# Patient Record
Sex: Female | Born: 1965 | Race: White | Hispanic: No | Marital: Single | Smoking: Former smoker
Health system: Southern US, Community
[De-identification: ages and names within clinical notes are randomized; demographics above are authoritative.]

## PROBLEM LIST (undated history)

## (undated) DIAGNOSIS — F32A Depression, unspecified: Secondary | ICD-10-CM

## (undated) DIAGNOSIS — M1990 Unspecified osteoarthritis, unspecified site: Secondary | ICD-10-CM

## (undated) DIAGNOSIS — F329 Major depressive disorder, single episode, unspecified: Secondary | ICD-10-CM

## (undated) DIAGNOSIS — T7840XA Allergy, unspecified, initial encounter: Secondary | ICD-10-CM

## (undated) DIAGNOSIS — E039 Hypothyroidism, unspecified: Secondary | ICD-10-CM

## (undated) DIAGNOSIS — F419 Anxiety disorder, unspecified: Secondary | ICD-10-CM

## (undated) HISTORY — PX: APPENDECTOMY: SHX54

## (undated) HISTORY — DX: Anxiety disorder, unspecified: F41.9

## (undated) HISTORY — DX: Depression, unspecified: F32.A

## (undated) HISTORY — DX: Unspecified osteoarthritis, unspecified site: M19.90

## (undated) HISTORY — DX: Allergy, unspecified, initial encounter: T78.40XA

## (undated) HISTORY — DX: Major depressive disorder, single episode, unspecified: F32.9

## (undated) HISTORY — DX: Hypothyroidism, unspecified: E03.9

---

## 2017-05-06 ENCOUNTER — Emergency Department
Admission: EM | Admit: 2017-05-06 | Discharge: 2017-05-06 | Disposition: A | Discharge status: Home / Self Care | Payer: Self-pay | Attending: Emergency Medicine | Admitting: Emergency Medicine

## 2017-05-06 ENCOUNTER — Encounter: Payer: Self-pay | Admitting: Emergency Medicine

## 2017-05-06 DIAGNOSIS — S81812A Laceration without foreign body, left lower leg, initial encounter: Secondary | ICD-10-CM | POA: Insufficient documentation

## 2017-05-06 DIAGNOSIS — Y929 Unspecified place or not applicable: Secondary | ICD-10-CM | POA: Insufficient documentation

## 2017-05-06 DIAGNOSIS — W01198A Fall on same level from slipping, tripping and stumbling with subsequent striking against other object, initial encounter: Secondary | ICD-10-CM | POA: Insufficient documentation

## 2017-05-06 DIAGNOSIS — F17200 Nicotine dependence, unspecified, uncomplicated: Secondary | ICD-10-CM | POA: Insufficient documentation

## 2017-05-06 DIAGNOSIS — Y9301 Activity, walking, marching and hiking: Secondary | ICD-10-CM | POA: Insufficient documentation

## 2017-05-06 DIAGNOSIS — Z23 Encounter for immunization: Secondary | ICD-10-CM | POA: Insufficient documentation

## 2017-05-06 DIAGNOSIS — Y999 Unspecified external cause status: Secondary | ICD-10-CM | POA: Insufficient documentation

## 2017-05-06 DIAGNOSIS — W25XXXA Contact with sharp glass, initial encounter: Secondary | ICD-10-CM | POA: Insufficient documentation

## 2017-05-06 MED ORDER — LORAZEPAM 1 MG PO TABS
1.0000 mg | ORAL_TABLET | Freq: Once | ORAL | Status: AC
  Administered 2017-05-06: 1 mg via ORAL
  Filled 2017-05-06: qty 1

## 2017-05-06 MED ORDER — CEPHALEXIN 500 MG PO CAPS: 500.0000 mg | ORAL_CAPSULE | Freq: Four times a day (QID) | ORAL | 0 refills | Status: AC

## 2017-05-06 MED ORDER — LIDOCAINE HCL (PF) 1 % IJ SOLN
5.0000 mL | Freq: Once | INTRAMUSCULAR | Status: DC
  Filled 2017-05-06: qty 5

## 2017-05-06 MED ORDER — TETANUS-DIPHTH-ACELL PERTUSSIS 5-2.5-18.5 LF-MCG/0.5 IM SUSP
0.5000 mL | Freq: Once | INTRAMUSCULAR | Status: AC
  Administered 2017-05-06: 0.5 mL via INTRAMUSCULAR
  Filled 2017-05-06: qty 0.5

## 2017-05-06 NOTE — ED Provider Notes (Signed)
[facility] Emergency Department Provider Note  ____________________________________________  Time seen: Approximately 12:26 PM  I have reviewed the triage vital signs and the nursing notes.   HISTORY  Chief Complaint Extremity Laceration    HPI Nicole Mccarty is a 51 y.o. female that presents to the emergency department with left foot laceration. Patient cut her foot on glass last night after tripping over her dog. She denies any additional injuries. She does not recall last tetanus shot. No fever, shortness of breath, chest pain, numbness, tingling.   History reviewed. No pertinent past medical history.  There are no active problems to display for this patient.   History reviewed. No pertinent surgical history.  Prior to Admission medications   Medication Sig Start Date End Date Taking? Authorizing Provider  cephALEXin (KEFLEX) 500 MG capsule Take 1 capsule (500 mg total) by mouth 4 (four) times daily. 05/06/17 05/16/17  Laban Emperor, PA-C    Allergies Patient has no known allergies.  No family history on file.  Social History Social History  Substance Use Topics  . Smoking status: Current Some Day Smoker  . Smokeless tobacco: Never Used  . Alcohol use Yes     Comment: 1 beer per day     Review of Systems  Constitutional: No fever/chills Cardiovascular: No chest pain. Respiratory: No SOB. Gastrointestinal: No abdominal pain.  No nausea, no vomiting.  Skin: Negative for rash,  ecchymosis. Neurological: Negative for headaches, numbness or tingling   ____________________________________________   PHYSICAL EXAM:  VITAL SIGNS: ED Triage Vitals  Enc Vitals Group     BP 05/06/17 1136 (!) 173/96     Pulse Rate 05/06/17 1136 98     Resp --      Temp 05/06/17 1136 98 F (36.7 C)     Temp Source 05/06/17 1136 Oral     SpO2 05/06/17 1136 100 %     Weight 05/06/17 1132 140 lb (63.5 kg)     Height 05/06/17 1132 5\' 5"  (1.651 m)     Head  Circumference --      Peak Flow --      Pain Score 05/06/17 1132 0     Pain Loc --      Pain Edu? --      Excl. in [place]? --      Constitutional: Alert and oriented. Well appearing and in no acute distress. Eyes: Conjunctivae are normal. PERRL. EOMI. Head: Atraumatic. ENT:      Ears:      Nose: No congestion/rhinnorhea.      Mouth/Throat: Mucous membranes are moist.  Neck: No stridor.  Cardiovascular: Normal rate, regular rhythm.  Good peripheral circulation. Respiratory: Normal respiratory effort without tachypnea or retractions. Lungs CTAB. Good air entry to the bases with no decreased or absent breath sounds. Musculoskeletal: Full range of motion to all extremities. No gross deformities appreciated. Neurologic:  Normal speech and language. No gross focal neurologic deficits are appreciated.  Skin:  Skin is warm, dry and intact. 2 cm well approximated shallow laceration to left shin.   ____________________________________________   LABS (all labs ordered are listed, but only abnormal results are displayed)  Labs Reviewed - No data to display ____________________________________________  EKG   ____________________________________________  RADIOLOGY  No results found.  ____________________________________________    PROCEDURES  Procedure(s) performed:    Procedures  LACERATION REPAIR Performed by: Laban Emperor  Consent: Verbal consent obtained.  Consent given by: patient  Prepped and Draped in normal sterile fashion  Wound explored: No foreign bodies   Laceration Location: left shin  Laceration Length: 2 cm  Anesthesia: None  Local anesthetic: lidocaine 1% without epinephrine  Anesthetic total: 3 ml  Irrigation method: syringe  Amount of cleaning: 554ml normal saline  Skin closure: 4-0 nylon  Number of sutures: 7  Technique: Simple interrupted  Patient tolerance: Patient tolerated the procedure well with no immediate  complications.  Medications  lidocaine (PF) (XYLOCAINE) 1 % injection 5 mL (not administered)  LORazepam (ATIVAN) tablet 1 mg (1 mg Oral Given 05/06/17 1203)  Tdap (BOOSTRIX) injection 0.5 mL (0.5 mLs Intramuscular Given 05/06/17 1202)     ____________________________________________   INITIAL IMPRESSION / ASSESSMENT AND PLAN / ED COURSE  Pertinent labs & imaging results that were available during my care of the patient were reviewed by me and considered in my medical decision making (see chart for details).  Review of the [place] CSRS was performed in accordance of the [place] prior to dispensing any controlled drugs.    Patient's diagnosis is consistent with leg laceration. Vital signs and exam are reassuring. Laceration was repaired with stitches. Patient will be discharged home with prescriptions for keflex. Patient is to follow up with PCP as directed. Patient is given ED precautions to return to the ED for any worsening or new symptoms.     ____________________________________________  FINAL CLINICAL IMPRESSION(S) / ED DIAGNOSES  Final diagnoses:  Laceration of left lower extremity, initial encounter      NEW MEDICATIONS STARTED DURING THIS VISIT:  Discharge Medication List as of 05/06/2017  1:06 PM    START taking these medications   Details  cephALEXin (KEFLEX) 500 MG capsule Take 1 capsule (500 mg total) by mouth 4 (four) times daily., Starting Sun 05/06/2017, Until Wed 05/16/2017, Print            This chart was dictated using voice recognition software/Dragon. Despite best efforts to proofread, errors can occur which can change the meaning. Any change was purely unintentional.    Laban Emperor, PA-C 05/06/17 1534    Delman Kitten, MD 05/06/17 [phone]

## 2017-05-06 NOTE — ED Triage Notes (Signed)
Patient presents to the ED with a laceration to her left foot that occurred around midnight last night.  Patient states she accidentally knocked a pane of glass off the wall and it fell and hit her foot.  Patient describes the laceration as approx. 4 inches long.  Patient has a long bandaid to area at this time.

## 2018-05-22 ENCOUNTER — Telehealth: Payer: Self-pay

## 2018-05-22 NOTE — Telephone Encounter (Signed)
Copied from [id number]. Topic: Appointment Scheduling - New Patient >> May 22, 2018 11:15 AM Margot Ables wrote: New patient has been scheduled for your office. Provider: McLean-Scocuzza Date of Appointment: 07/05/18  Route to department's PEC pool.

## 2018-05-29 NOTE — Telephone Encounter (Signed)
NP paperwork mailed out on 10/02.

## 2018-07-05 ENCOUNTER — Other Ambulatory Visit: Payer: Self-pay

## 2018-07-05 ENCOUNTER — Ambulatory Visit (INDEPENDENT_AMBULATORY_CARE_PROVIDER_SITE_OTHER): Payer: BLUE CROSS/BLUE SHIELD

## 2018-07-05 ENCOUNTER — Encounter: Payer: Self-pay | Admitting: Internal Medicine

## 2018-07-05 ENCOUNTER — Ambulatory Visit (INDEPENDENT_AMBULATORY_CARE_PROVIDER_SITE_OTHER): Payer: BLUE CROSS/BLUE SHIELD | Admitting: Internal Medicine

## 2018-07-05 VITALS — BP 140/98 | HR 72 | Temp 98.0°F | Resp 16 | Ht 65.0 in | Wt 157.0 lb

## 2018-07-05 DIAGNOSIS — M79641 Pain in right hand: Secondary | ICD-10-CM

## 2018-07-05 DIAGNOSIS — E039 Hypothyroidism, unspecified: Secondary | ICD-10-CM

## 2018-07-05 DIAGNOSIS — M2550 Pain in unspecified joint: Secondary | ICD-10-CM | POA: Diagnosis not present

## 2018-07-05 DIAGNOSIS — Z1231 Encounter for screening mammogram for malignant neoplasm of breast: Secondary | ICD-10-CM | POA: Diagnosis not present

## 2018-07-05 DIAGNOSIS — M79675 Pain in left toe(s): Secondary | ICD-10-CM

## 2018-07-05 DIAGNOSIS — E559 Vitamin D deficiency, unspecified: Secondary | ICD-10-CM

## 2018-07-05 DIAGNOSIS — Z1389 Encounter for screening for other disorder: Secondary | ICD-10-CM

## 2018-07-05 DIAGNOSIS — Z1211 Encounter for screening for malignant neoplasm of colon: Secondary | ICD-10-CM | POA: Diagnosis not present

## 2018-07-05 DIAGNOSIS — M1990 Unspecified osteoarthritis, unspecified site: Secondary | ICD-10-CM | POA: Insufficient documentation

## 2018-07-05 DIAGNOSIS — Z13220 Encounter for screening for lipoid disorders: Secondary | ICD-10-CM | POA: Diagnosis not present

## 2018-07-05 DIAGNOSIS — M79642 Pain in left hand: Secondary | ICD-10-CM

## 2018-07-05 DIAGNOSIS — I10 Essential (primary) hypertension: Secondary | ICD-10-CM

## 2018-07-05 DIAGNOSIS — T7840XA Allergy, unspecified, initial encounter: Secondary | ICD-10-CM

## 2018-07-05 DIAGNOSIS — Z8371 Family history of colonic polyps: Secondary | ICD-10-CM

## 2018-07-05 LAB — COMPREHENSIVE METABOLIC PANEL
ALK PHOS: 53 U/L (ref 39–117)
ALT: 11 U/L (ref 0–35)
AST: 13 U/L (ref 0–37)
Albumin: 4.4 g/dL (ref 3.5–5.2)
BILIRUBIN TOTAL: 0.4 mg/dL (ref 0.2–1.2)
BUN: 13 mg/dL (ref 6–23)
CO2: 29 meq/L (ref 19–32)
Calcium: 9.9 mg/dL (ref 8.4–10.5)
Chloride: 103 mEq/L (ref 96–112)
Creatinine, Ser: 0.63 mg/dL (ref 0.40–1.20)
GFR: 104.97 mL/min (ref >60.00)
Glucose, Bld: 102 mg/dL — ABNORMAL HIGH (ref 70–99)
Potassium: 4.6 mEq/L (ref 3.5–5.1)
Sodium: 139 mEq/L (ref 135–145)
Total Protein: 7 g/dL (ref 6.0–8.3)

## 2018-07-05 LAB — CBC WITH DIFFERENTIAL/PLATELET
BASOS ABS: 0.1 10*3/uL (ref 0.0–0.1)
Basophils Relative: 1 % (ref 0.0–3.0)
Eosinophils Absolute: 0.1 10*3/uL (ref 0.0–0.7)
Eosinophils Relative: 2.3 % (ref 0.0–5.0)
HCT: 39.5 % (ref 36.0–46.0)
HEMOGLOBIN: 13.2 g/dL (ref 12.0–15.0)
Lymphocytes Relative: 30.1 % (ref 12.0–46.0)
Lymphs Abs: 1.7 10*3/uL (ref 0.7–4.0)
MCHC: 33.3 g/dL (ref 30.0–36.0)
MCV: 94.3 fl (ref 78.0–100.0)
MONOS PCT: 7.1 % (ref 3.0–12.0)
Monocytes Absolute: 0.4 10*3/uL (ref 0.1–1.0)
NEUTROS ABS: 3.4 10*3/uL (ref 1.4–7.7)
Neutrophils Relative %: 59.5 % (ref 43.0–77.0)
Platelets: 342 10*3/uL (ref 150.0–400.0)
RBC: 4.19 Mil/uL (ref 3.87–5.11)
RDW: 13.1 % (ref 11.5–15.5)
WBC: 5.8 10*3/uL (ref 4.0–10.5)

## 2018-07-05 LAB — URINALYSIS, ROUTINE W REFLEX MICROSCOPIC
Bilirubin Urine: NEGATIVE
Glucose, UA: NEGATIVE
Hgb urine dipstick: NEGATIVE
Ketones, ur: NEGATIVE
LEUKOCYTES UA: NEGATIVE
NITRITE: NEGATIVE
PROTEIN: NEGATIVE
SPECIFIC GRAVITY, URINE: 1.005 (ref 1.001–1.03)
pH: 7 (ref 5.0–8.0)

## 2018-07-05 LAB — URIC ACID: Uric Acid, Serum: 3.6 mg/dL (ref 2.4–7.0)

## 2018-07-05 LAB — LIPID PANEL
CHOLESTEROL: 188 mg/dL (ref 0–200)
HDL: 71.8 mg/dL (ref >39.00)
LDL CALC: 103 mg/dL — AB (ref 0–99)
NonHDL: 116.5
TRIGLYCERIDES: 70 mg/dL (ref 0.0–149.0)
Total CHOL/HDL Ratio: 3
VLDL: 14 mg/dL (ref 0.0–40.0)

## 2018-07-05 LAB — T4, FREE: Free T4: 0.81 ng/dL (ref 0.60–1.60)

## 2018-07-05 LAB — VITAMIN D 25 HYDROXY (VIT D DEFICIENCY, FRACTURES): VITD: 39.12 ng/mL (ref 30.00–100.00)

## 2018-07-05 LAB — C-REACTIVE PROTEIN: CRP: 0.1 mg/dL — AB (ref 0.5–20.0)

## 2018-07-05 LAB — SEDIMENTATION RATE: Sed Rate: 2 mm/hr (ref 0–30)

## 2018-07-05 LAB — TSH: TSH: 1.73 u[IU]/mL (ref 0.35–4.50)

## 2018-07-05 MED ORDER — HYDROCHLOROTHIAZIDE 12.5 MG PO TABS: 12.5000 mg | ORAL_TABLET | Freq: Every day | ORAL | 2 refills | Status: DC

## 2018-07-05 NOTE — Patient Instructions (Signed)
Arthritis Arthritis is a term that is commonly used to refer to joint pain or joint disease. There are more than 100 types of arthritis. What are the causes? The most common cause of this condition is wear and tear of a joint. Other causes include:  Gout.  Inflammation of a joint.  An infection of a joint.  Sprains and other injuries near the joint.  A drug reaction or allergic reaction.  In some cases, the cause may not be known. What are the signs or symptoms? The main symptom of this condition is pain in the joint with movement. Other symptoms include:  Redness, swelling, or stiffness at a joint.  Warmth coming from the joint.  Fever.  Overall feeling of illness.  How is this diagnosed? This condition may be diagnosed with a physical exam and tests, including:  Blood tests.  Urine tests.  Imaging tests, such as MRI, X-rays, or a CT scan.  Sometimes, fluid is removed from a joint for testing. How is this treated? Treatment for this condition may involve:  Treatment of the cause, if it is known.  Rest.  Raising (elevating) the joint.  Applying cold or hot packs to the joint.  Medicines to improve symptoms and reduce inflammation.  Injections of a steroid such as cortisone into the joint to help reduce pain and inflammation.  Depending on the cause of your arthritis, you may need to make lifestyle changes to reduce stress on your joint. These changes may include exercising more and losing weight. Follow these instructions at home: Medicines  Take over-the-counter and prescription medicines only as told by your health care provider.  Do not take aspirin to relieve pain if gout is suspected. Activity  Rest your joint if told by your health care provider. Rest is important when your disease is active and your joint feels painful, swollen, or stiff.  Avoid activities that make the pain worse. It is important to balance activity with rest.  Exercise your  joint regularly with range-of-motion exercises as told by your health care provider. Try doing low-impact exercise, such as: ? Swimming. ? Water aerobics. ? Biking. ? Walking. Joint Care   If your joint is swollen, keep it elevated if told by your health care provider.  If your joint feels stiff in the morning, try taking a warm shower.  If directed, apply heat to the joint. If you have diabetes, do not apply heat without permission from your health care provider. ? Put a towel between the joint and the hot pack or heating pad. ? Leave the heat on the area for 20-30 minutes.  If directed, apply ice to the joint: ? Put ice in a plastic bag. ? Place a towel between your skin and the bag. ? Leave the ice on for 20 minutes, 2-3 times per day.  Keep all follow-up visits as told by your health care provider. This is important. Contact a health care provider if:  The pain gets worse.  You have a fever. Get help right away if:  You develop severe joint pain, swelling, or redness.  Many joints become painful and swollen.  You develop severe back pain.  You develop severe weakness in your leg.  You cannot control your bladder or bowels. This information is not intended to replace advice given to you by your health care provider. Make sure you discuss any questions you have with your health care provider. Document Released: 09/21/2004 Document Revised: 01/20/2016 Document Reviewed: 11/09/2014 Elsevier Interactive Patient   Education  2018 [place] Eating Plan DASH stands for "Dietary Approaches to Stop Hypertension." The DASH eating plan is a healthy eating plan that has been shown to reduce high blood pressure (hypertension). It may also reduce your risk for type 2 diabetes, heart disease, and stroke. The DASH eating plan may also help with weight loss. What are tips for following this plan? General guidelines  Avoid eating more than 2,300 mg (milligrams) of salt  (sodium) a day. If you have hypertension, you may need to reduce your sodium intake to 1,500 mg a day.  Limit alcohol intake to no more than 1 drink a day for nonpregnant women and 2 drinks a day for men. One drink equals 12 oz of beer, 5 oz of wine, or 1 oz of hard liquor.  Work with your health care provider to maintain a healthy body weight or to lose weight. Ask what an ideal weight is for you.  Get at least 30 minutes of exercise that causes your heart to beat faster (aerobic exercise) most days of the week. Activities may include walking, swimming, or biking.  Work with your health care provider or diet and nutrition specialist (dietitian) to adjust your eating plan to your individual calorie needs. Reading food labels  Check food labels for the amount of sodium per serving. Choose foods with less than 5 percent of the Daily Value of sodium. Generally, foods with less than 300 mg of sodium per serving fit into this eating plan.  To find whole grains, look for the word "whole" as the first word in the ingredient list. Shopping  Buy products labeled as "low-sodium" or "no salt added."  Buy fresh foods. Avoid canned foods and premade or frozen meals. Cooking  Avoid adding salt when cooking. Use salt-free seasonings or herbs instead of table salt or sea salt. Check with your health care provider or pharmacist before using salt substitutes.  Do not fry foods. Cook foods using healthy methods such as baking, boiling, grilling, and broiling instead.  Cook with heart-healthy oils, such as olive, canola, soybean, or sunflower oil. Meal planning   Eat a balanced diet that includes: ? 5 or more servings of fruits and vegetables each day. At each meal, try to fill half of your plate with fruits and vegetables. ? Up to 6-8 servings of whole grains each day. ? Less than 6 oz of lean meat, poultry, or fish each day. A 3-oz serving of meat is about the same size as a deck of cards. One egg  equals 1 oz. ? 2 servings of low-fat dairy each day. ? A serving of nuts, seeds, or beans 5 times each week. ? Heart-healthy fats. Healthy fats called Omega-3 fatty acids are found in foods such as flaxseeds and coldwater fish, like sardines, salmon, and mackerel.  Limit how much you eat of the following: ? Canned or prepackaged foods. ? Food that is high in trans fat, such as fried foods. ? Food that is high in saturated fat, such as fatty meat. ? Sweets, desserts, sugary drinks, and other foods with added sugar. ? Full-fat dairy products.  Do not salt foods before eating.  Try to eat at least 2 vegetarian meals each week.  Eat more home-cooked food and less restaurant, buffet, and fast food.  When eating at a restaurant, ask that your food be prepared with less salt or no salt, if possible. What foods are recommended? The items listed may not be a complete  list. Talk with your dietitian about what dietary choices are best for you. Grains Whole-grain or whole-wheat bread. Whole-grain or whole-wheat pasta. Brown rice. [place]. Bulgur. Whole-grain and low-sodium cereals. Pita bread. Low-fat, low-sodium crackers. Whole-wheat flour tortillas. Vegetables Fresh or frozen vegetables (raw, steamed, roasted, or grilled). Low-sodium or reduced-sodium tomato and vegetable juice. Low-sodium or reduced-sodium tomato sauce and tomato paste. Low-sodium or reduced-sodium canned vegetables. Fruits All fresh, dried, or frozen fruit. Canned fruit in natural juice (without added sugar). Meat and other protein foods Skinless chicken or [place]. Ground chicken or [place]. Pork with fat trimmed off. Fish and seafood. Egg whites. Dried beans, peas, or lentils. Unsalted nuts, nut butters, and seeds. Unsalted canned beans. Lean cuts of beef with fat trimmed off. Low-sodium, lean deli meat. Dairy Low-fat (1%) or fat-free (skim) milk. Fat-free, low-fat, or reduced-fat cheeses. Nonfat, low-sodium ricotta or  cottage cheese. Low-fat or nonfat yogurt. Low-fat, low-sodium cheese. Fats and oils Soft margarine without trans fats. Vegetable oil. Low-fat, reduced-fat, or light mayonnaise and salad dressings (reduced-sodium). Canola, safflower, olive, soybean, and sunflower oils. Avocado. Seasoning and other foods Herbs. Spices. Seasoning mixes without salt. Unsalted popcorn and pretzels. Fat-free sweets. What foods are not recommended? The items listed may not be a complete list. Talk with your dietitian about what dietary choices are best for you. Grains Baked goods made with fat, such as croissants, muffins, or some breads. Dry pasta or rice meal packs. Vegetables Creamed or fried vegetables. Vegetables in a cheese sauce. Regular canned vegetables (not low-sodium or reduced-sodium). Regular canned tomato sauce and paste (not low-sodium or reduced-sodium). Regular tomato and vegetable juice (not low-sodium or reduced-sodium). Angie Fava. Olives. Fruits Canned fruit in a light or heavy syrup. Fried fruit. Fruit in cream or butter sauce. Meat and other protein foods Fatty cuts of meat. Ribs. Fried meat. Berniece Salines. Sausage. Bologna and other processed lunch meats. Salami. Fatback. Hotdogs. Bratwurst. Salted nuts and seeds. Canned beans with added salt. Canned or smoked fish. Whole eggs or egg yolks. Chicken or [place] with skin. Dairy Whole or 2% milk, cream, and half-and-half. Whole or full-fat cream cheese. Whole-fat or sweetened yogurt. Full-fat cheese. Nondairy creamers. Whipped toppings. Processed cheese and cheese spreads. Fats and oils Butter. Stick margarine. Lard. Shortening. Ghee. Bacon fat. Tropical oils, such as coconut, palm kernel, or palm oil. Seasoning and other foods Salted popcorn and pretzels. Onion salt, garlic salt, seasoned salt, table salt, and sea salt. Worcestershire sauce. Tartar sauce. Barbecue sauce. Teriyaki sauce. Soy sauce, including reduced-sodium. Steak sauce. Canned and packaged  gravies. Fish sauce. Oyster sauce. Cocktail sauce. Horseradish that you find on the shelf. Ketchup. Mustard. Meat flavorings and tenderizers. Bouillon cubes. Hot sauce and Tabasco sauce. Premade or packaged marinades. Premade or packaged taco seasonings. Relishes. Regular salad dressings. Where to find more information:  National Heart, Lung, and [place]: https://wilson-eaton.com/  American Heart Association: www.heart.org Summary  The DASH eating plan is a healthy eating plan that has been shown to reduce high blood pressure (hypertension). It may also reduce your risk for type 2 diabetes, heart disease, and stroke.  With the DASH eating plan, you should limit salt (sodium) intake to 2,300 mg a day. If you have hypertension, you may need to reduce your sodium intake to 1,500 mg a day.  When on the DASH eating plan, aim to eat more fresh fruits and vegetables, whole grains, lean proteins, low-fat dairy, and heart-healthy fats.  Work with your health care provider or diet and nutrition specialist (dietitian) to  adjust your eating plan to your individual calorie needs. This information is not intended to replace advice given to you by your health care provider. Make sure you discuss any questions you have with your health care provider. Document Released: 08/03/2011 Document Revised: 08/07/2016 Document Reviewed: 08/07/2016 Elsevier Interactive Patient Education  2018 [employer].  Hypertension Hypertension, commonly called high blood pressure, is when the force of blood pumping through the arteries is too strong. The arteries are the blood vessels that carry blood from the heart throughout the body. Hypertension forces the heart to work harder to pump blood and may cause arteries to become narrow or stiff. Having untreated or uncontrolled hypertension can cause heart attacks, strokes, kidney disease, and other problems. A blood pressure reading consists of a higher number over a lower number.  Ideally, your blood pressure should be below 120/80. The first ("top") number is called the systolic pressure. It is a measure of the pressure in your arteries as your heart beats. The second ("bottom") number is called the diastolic pressure. It is a measure of the pressure in your arteries as the heart relaxes. What are the causes? The cause of this condition is not known. What increases the risk? Some risk factors for high blood pressure are under your control. Others are not. Factors you can change  Smoking.  Having type 2 diabetes mellitus, high cholesterol, or both.  Not getting enough exercise or physical activity.  Being overweight.  Having too much fat, sugar, calories, or salt (sodium) in your diet.  Drinking too much alcohol. Factors that are difficult or impossible to change  Having chronic kidney disease.  Having a family history of high blood pressure.  Age. Risk increases with age.  Race. You may be at higher risk if you are African-American.  Gender. Men are at higher risk than women before age 64. After age 59, women are at higher risk than men.  Having obstructive sleep apnea.  Stress. What are the signs or symptoms? Extremely high blood pressure (hypertensive crisis) may cause:  Headache.  Anxiety.  Shortness of breath.  Nosebleed.  Nausea and vomiting.  Severe chest pain.  Jerky movements you cannot control (seizures).  How is this diagnosed? This condition is diagnosed by measuring your blood pressure while you are seated, with your arm resting on a surface. The cuff of the blood pressure monitor will be placed directly against the skin of your upper arm at the level of your heart. It should be measured at least twice using the same arm. Certain conditions can cause a difference in blood pressure between your right and left arms. Certain factors can cause blood pressure readings to be lower or higher than normal (elevated) for a short period of  time:  When your blood pressure is higher when you are in a health care provider's office than when you are at home, this is called white coat hypertension. Most people with this condition do not need medicines.  When your blood pressure is higher at home than when you are in a health care provider's office, this is called masked hypertension. Most people with this condition may need medicines to control blood pressure.  If you have a high blood pressure reading during one visit or you have normal blood pressure with other risk factors:  You may be asked to return on a different day to have your blood pressure checked again.  You may be asked to monitor your blood pressure at home for 1  week or longer.  If you are diagnosed with hypertension, you may have other blood or imaging tests to help your health care provider understand your overall risk for other conditions. How is this treated? This condition is treated by making healthy lifestyle changes, such as eating healthy foods, exercising more, and reducing your alcohol intake. Your health care provider may prescribe medicine if lifestyle changes are not enough to get your blood pressure under control, and if:  Your systolic blood pressure is above 130.  Your diastolic blood pressure is above 80.  Your personal target blood pressure may vary depending on your medical conditions, your age, and other factors. Follow these instructions at home: Eating and drinking  Eat a diet that is high in fiber and potassium, and low in sodium, added sugar, and fat. An example eating plan is called the DASH (Dietary Approaches to Stop Hypertension) diet. To eat this way: ? Eat plenty of fresh fruits and vegetables. Try to fill half of your plate at each meal with fruits and vegetables. ? Eat whole grains, such as whole wheat pasta, brown rice, or whole grain bread. Fill about one quarter of your plate with whole grains. ? Eat or drink low-fat dairy  products, such as skim milk or low-fat yogurt. ? Avoid fatty cuts of meat, processed or cured meats, and poultry with skin. Fill about one quarter of your plate with lean proteins, such as fish, chicken without skin, beans, eggs, and tofu. ? Avoid premade and processed foods. These tend to be higher in sodium, added sugar, and fat.  Reduce your daily sodium intake. Most people with hypertension should eat less than 1,500 mg of sodium a day.  Limit alcohol intake to no more than 1 drink a day for nonpregnant women and 2 drinks a day for men. One drink equals 12 oz of beer, 5 oz of wine, or 1 oz of hard liquor. Lifestyle  Work with your health care provider to maintain a healthy body weight or to lose weight. Ask what an ideal weight is for you.  Get at least 30 minutes of exercise that causes your heart to beat faster (aerobic exercise) most days of the week. Activities may include walking, swimming, or biking.  Include exercise to strengthen your muscles (resistance exercise), such as pilates or lifting weights, as part of your weekly exercise routine. Try to do these types of exercises for 30 minutes at least 3 days a week.  Do not use any products that contain nicotine or tobacco, such as cigarettes and e-cigarettes. If you need help quitting, ask your health care provider.  Monitor your blood pressure at home as told by your health care provider.  Keep all follow-up visits as told by your health care provider. This is important. Medicines  Take over-the-counter and prescription medicines only as told by your health care provider. Follow directions carefully. Blood pressure medicines must be taken as prescribed.  Do not skip doses of blood pressure medicine. Doing this puts you at risk for problems and can make the medicine less effective.  Ask your health care provider about side effects or reactions to medicines that you should watch for. Contact a health care provider if:  You  think you are having a reaction to a medicine you are taking.  You have headaches that keep coming back (recurring).  You feel dizzy.  You have swelling in your ankles.  You have trouble with your vision. Get help right away if:  You  develop a severe headache or confusion.  You have unusual weakness or numbness.  You feel faint.  You have severe pain in your chest or abdomen.  You vomit repeatedly.  You have trouble breathing. Summary  Hypertension is when the force of blood pumping through your arteries is too strong. If this condition is not controlled, it may put you at risk for serious complications.  Your personal target blood pressure may vary depending on your medical conditions, your age, and other factors. For most people, a normal blood pressure is less than 120/80.  Hypertension is treated with lifestyle changes, medicines, or a combination of both. Lifestyle changes include weight loss, eating a healthy, low-sodium diet, exercising more, and limiting alcohol. This information is not intended to replace advice given to you by your health care provider. Make sure you discuss any questions you have with your health care provider. Document Released: 08/14/2005 Document Revised: 07/12/2016 Document Reviewed: 07/12/2016 Elsevier Interactive Patient Education  [employer].

## 2018-07-05 NOTE — Progress Notes (Signed)
Chief Complaint  Patient presents with  . Establish Care   New patient  1. C/o b/l hand pain and jt swelling left >right and left toe pain she takes aleve as needed with relief  2. H/o anxiety depression when dad died was on klonopin, prozac and wellbutrin does not want to restart  3. Hypothyroidism was on levo 125 mcg 10 years ago not off  4. H/o HTN not on meds    Review of Systems  Constitutional: Negative for weight loss.  HENT: Negative for hearing loss.   Eyes: Negative for blurred vision.  Respiratory: Negative for shortness of breath.   Cardiovascular: Negative for chest pain.  Gastrointestinal: Negative for abdominal pain.  Musculoskeletal: Positive for joint pain.  Skin: Negative for rash.  Neurological: Negative for headaches.  Psychiatric/Behavioral: Negative for depression. The patient is not nervous/anxious.    Past Medical History:  Diagnosis Date  . Allergy   . Anxiety    was on klonopin, wellbutrin, prozac now off no restart   . Arthritis    hands   . Depression   . Hypothyroidism    Past Surgical History:  Procedure Laterality Date  . APPENDECTOMY     1979   Family History  Problem Relation Age of Onset  . Arthritis Mother   . Cancer Mother        mouth cancer roof of mouth  . Hearing loss Mother   . Heart disease Mother   . Kidney disease Mother   . Hypertension Mother   . Cancer Father        melanoma  . Diabetes Father   . Hearing loss Father   . Heart disease Father   . Hypertension Father   . Hyperlipidemia Father   . Depression Sister   . Hyperlipidemia Sister   . Arthritis Brother   . Depression Brother   . Hyperlipidemia Brother   . Hypertension Brother    Social History   Socioeconomic History  . Marital status: Single    Spouse name: Not on file  . Number of children: Not on file  . Years of education: Not on file  . Highest education level: Not on file  Occupational History  . Not on file  Social Needs  . Financial  resource strain: Not on file  . Food insecurity:    Worry: Not on file    Inability: Not on file  . Transportation needs:    Medical: Not on file    Non-medical: Not on file  Tobacco Use  . Smoking status: Former Research scientist (life sciences)  . Smokeless tobacco: Never Used  . Tobacco comment: smoked in 180s x 6-7 years light   Substance and Sexual Activity  . Alcohol use: Yes    Comment: 1 beer per day  . Drug use: Not Currently  . Sexual activity: Not Currently    Comment: men  Lifestyle  . Physical activity:    Days per week: Not on file    Minutes per session: Not on file  . Stress: Not on file  Relationships  . Social connections:    Talks on phone: Not on file    Gets together: Not on file    Attends religious service: Not on file    Active member of club or organization: Not on file    Attends meetings of clubs or organizations: Not on file    Relationship status: Not on file  . Intimate partner violence:    Fear of current or  ex partner: Not on file    Emotionally abused: Not on file    Physically abused: Not on file    Forced sexual activity: Not on file  Other Topics Concern  . 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 CALCIUM/MAGNESIUM/ZINC FORMULA PO Take by mouth daily.  . cetirizine (ZYRTEC) 10 MG tablet Take 10 mg by mouth daily.  Nyoka Cowden Tea 150 MG CAPS Take by mouth daily.   . Multiple Vitamins-Minerals (MULTIVITAMIN ADULT) CHEW Chew by mouth.   No Known Allergies No results found for this or any previous visit (from the past 2160 hour(s)). Objective  Body mass index is 26.13 kg/m. Wt Readings from Last 3 Encounters:  07/05/18 157 lb (71.2 kg)  05/06/17 140 lb (63.5 kg)   Temp Readings from Last 3 Encounters:  07/05/18 98 F (36.7 C) (Oral)  05/06/17 98 F (36.7 C) (Oral)   BP Readings  from Last 3 Encounters:  07/05/18 (!) 140/98  05/06/17 (!) 173/96   Pulse Readings from Last 3 Encounters:  07/05/18 72  05/06/17 98    Physical Exam  Constitutional: She is oriented to person, place, and time. Vital signs are normal. She appears well-developed and well-nourished. She is cooperative.  HENT:  Head: Normocephalic and atraumatic.  Mouth/Throat: Oropharynx is clear and moist and mucous membranes are normal.  Eyes: Pupils are equal, round, and reactive to light. Conjunctivae are normal.  Cardiovascular: Normal rate, regular rhythm and normal heart sounds.  Pulmonary/Chest: Effort normal and breath sounds normal.  Musculoskeletal:  MCP, PIP, DIP with swelling and pain   Neurological: She is alert and oriented to person, place, and time. Gait normal.  Skin: Skin is warm, dry and intact.  Psychiatric: She has a normal mood and affect. Her speech is normal and behavior is normal. Judgment and thought content normal. Cognition and memory are normal.  Nursing note and vitals reviewed.   Assessment   1. B/l hand pain and swelling and left toe pain ddx arthritis r/o autoimmune  2. Anxiety/depression controlled without meds  3. Hypothyroidism  4. HTN  5 .HM Plan   1. Xray left hand for now  Labs  2. Not meds desired  3. Check labs was on levo 125 years ago  4. hctz 12.5 mg qd  5.  utd flu and Tdap consider shingrix  Mammogram referred and colonoscopy referred  Will do pap at f/u   Fasting labs today  Derm consider in future former smoker   Provider: Dr. Olivia Mackie McLean-[facility]oc[facility]

## 2018-07-09 ENCOUNTER — Encounter: Payer: Self-pay | Admitting: *Deleted

## 2018-07-09 LAB — THYROID PEROXIDASE ANTIBODY: Thyroperoxidase Ab SerPl-aCnc: <1 IU/mL (ref <9)

## 2018-07-09 LAB — RHEUMATOID FACTOR: Rhuematoid fact SerPl-aCnc: <14 IU/mL (ref <14)

## 2018-07-09 LAB — CYCLIC CITRUL PEPTIDE ANTIBODY, IGG

## 2018-07-09 LAB — ANA: ANA: NEGATIVE

## 2018-07-19 ENCOUNTER — Other Ambulatory Visit: Payer: Self-pay

## 2018-07-19 MED ORDER — NA SULFATE-K SULFATE-MG SULF 17.5-3.13-1.6 GM/177ML PO SOLN: 1.0000 | ORAL | 0 refills | Status: DC

## 2018-07-23 ENCOUNTER — Encounter: Payer: Self-pay | Admitting: *Deleted

## 2018-07-23 ENCOUNTER — Ambulatory Visit: Payer: BLUE CROSS/BLUE SHIELD | Admitting: Certified Registered Nurse Anesthetist

## 2018-07-23 ENCOUNTER — Encounter
Admission: RE | Disposition: A | Discharge status: Home / Self Care | Payer: Self-pay | Source: Ambulatory Visit | Attending: Gastroenterology

## 2018-07-23 ENCOUNTER — Ambulatory Visit
Admission: RE | Admit: 2018-07-23 | Discharge: 2018-07-23 | Disposition: A | Discharge status: Home / Self Care | Payer: BLUE CROSS/BLUE SHIELD | Source: Ambulatory Visit | Attending: Gastroenterology | Admitting: Gastroenterology

## 2018-07-23 DIAGNOSIS — I10 Essential (primary) hypertension: Secondary | ICD-10-CM | POA: Diagnosis not present

## 2018-07-23 DIAGNOSIS — K5730 Diverticulosis of large intestine without perforation or abscess without bleeding: Secondary | ICD-10-CM | POA: Diagnosis not present

## 2018-07-23 DIAGNOSIS — Z8371 Family history of colonic polyps: Secondary | ICD-10-CM

## 2018-07-23 DIAGNOSIS — D127 Benign neoplasm of rectosigmoid junction: Secondary | ICD-10-CM

## 2018-07-23 DIAGNOSIS — Z1211 Encounter for screening for malignant neoplasm of colon: Secondary | ICD-10-CM

## 2018-07-23 DIAGNOSIS — Z87891 Personal history of nicotine dependence: Secondary | ICD-10-CM | POA: Diagnosis not present

## 2018-07-23 DIAGNOSIS — Z79899 Other long term (current) drug therapy: Secondary | ICD-10-CM | POA: Diagnosis not present

## 2018-07-23 DIAGNOSIS — K635 Polyp of colon: Secondary | ICD-10-CM | POA: Insufficient documentation

## 2018-07-23 HISTORY — PX: COLONOSCOPY WITH PROPOFOL: SHX5780

## 2018-07-23 LAB — POCT PREGNANCY, URINE: Preg Test, Ur: NEGATIVE

## 2018-07-23 SURGERY — COLONOSCOPY WITH PROPOFOL
Anesthesia: General

## 2018-07-23 MED ORDER — PROPOFOL 500 MG/50ML IV EMUL
INTRAVENOUS | Status: AC
  Filled 2018-07-23: qty 50

## 2018-07-23 MED ORDER — SODIUM CHLORIDE 0.9 % IV SOLN
INTRAVENOUS | Status: DC
  Administered 2018-07-23: 10:00:00 via INTRAVENOUS

## 2018-07-23 MED ORDER — LIDOCAINE HCL (PF) 2 % IJ SOLN
INTRAMUSCULAR | Status: AC
  Filled 2018-07-23: qty 10

## 2018-07-23 MED ORDER — PROPOFOL 500 MG/50ML IV EMUL
INTRAVENOUS | Status: DC | PRN
  Administered 2018-07-23: 140 ug/kg/min via INTRAVENOUS

## 2018-07-23 MED ORDER — PROPOFOL 10 MG/ML IV BOLUS
INTRAVENOUS | Status: DC | PRN
  Administered 2018-07-23: 18 mg via INTRAVENOUS
  Administered 2018-07-23: 90 mg via INTRAVENOUS

## 2018-07-23 MED ORDER — LIDOCAINE HCL (CARDIAC) PF 100 MG/5ML IV SOSY
PREFILLED_SYRINGE | INTRAVENOUS | Status: DC | PRN
  Administered 2018-07-23: 50 mg via INTRAVENOUS

## 2018-07-23 MED ORDER — MIDAZOLAM HCL 2 MG/2ML IJ SOLN
INTRAMUSCULAR | Status: AC
  Filled 2018-07-23: qty 2

## 2018-07-23 MED ORDER — MIDAZOLAM HCL 5 MG/5ML IJ SOLN
INTRAMUSCULAR | Status: DC | PRN
  Administered 2018-07-23: 2 mg via INTRAVENOUS

## 2018-07-23 NOTE — Op Note (Signed)
[facility] Gastroenterology Patient Name: Nicole Mccarty Procedure Date: 07/23/2018 10:25 AM MRN: [id number] Account #: [id number] Date of Birth: 1966-08-13 Admit Type: Outpatient Age: 52 Room: [facility] ENDO ROOM 4 Gender: Female Note Status: Finalized Procedure:            Colonoscopy Indications:          Screening for colorectal malignant neoplasm Providers:            Lucilla Lame MD, MD Medicines:            Propofol per Anesthesia Complications:        No immediate complications. Procedure:            Pre-Anesthesia Assessment:                       - Prior to the procedure, a History and Physical was                        performed, and patient medications and allergies were                        reviewed. The patient's tolerance of previous                        anesthesia was also reviewed. The risks and benefits of                        the procedure and the sedation options and risks were                        discussed with the patient. All questions were                        answered, and informed consent was obtained. Prior                        Anticoagulants: The patient has taken no previous                        anticoagulant or antiplatelet agents. ASA Grade                        Assessment: II - A patient with mild systemic disease.                        After reviewing the risks and benefits, the patient was                        deemed in satisfactory condition to undergo the                        procedure.                       After obtaining informed consent, the colonoscope was                        passed under direct vision. Throughout the procedure,                        the patient's blood pressure,  pulse, and oxygen                        saturations were monitored continuously. The                        Colonoscope was introduced through the anus and                        advanced to the the cecum, identified by  appendiceal                        orifice and ileocecal valve. The colonoscopy was                        performed without difficulty. The patient tolerated the                        procedure well. The quality of the bowel preparation                        was excellent. Findings:      The perianal and digital rectal examinations were normal.      Two sessile polyps were found in the recto-sigmoid colon. The polyps       were 2 to 3 mm in size. These polyps were removed with a cold biopsy       forceps. Resection and retrieval were complete.      Multiple small-mouthed diverticula were found in the sigmoid colon. Impression:           - Two 2 to 3 mm polyps at the recto-sigmoid colon,                        removed with a cold biopsy forceps. Resected and                        retrieved.                       - Diverticulosis in the sigmoid colon. Recommendation:       - Discharge patient to home.                       - Resume previous diet.                       - Continue present medications.                       - Await pathology results.                       - Repeat colonoscopy in 5 years for surveillance. Procedure Code(s):    --- Professional ---                       [phone], Colonoscopy, flexible; with biopsy, single or                        multiple Diagnosis Code(s):    --- Professional ---  Z12.11, Encounter for screening for malignant neoplasm                        of colon                       D12.7, Benign neoplasm of rectosigmoid junction CPT copyright 2018 American Medical Association. All rights reserved. The codes documented in this report are preliminary and upon coder review may  be revised to meet current compliance requirements. Lucilla Lame MD, MD 07/23/2018 11:32:41 AM This report has been signed electronically. Number of Addenda: 0 Note Initiated On: 07/23/2018 10:25 AM Scope Withdrawal Time: 0 hours 7 minutes 11 seconds   Total Procedure Duration: 0 hours 12 minutes 8 seconds       [facility]

## 2018-07-23 NOTE — Anesthesia Preprocedure Evaluation (Signed)
Anesthesia Evaluation  Patient identified by MRN, date of birth, ID band Patient awake    Reviewed: Allergy & Precautions, H&P , NPO status , Patient's Chart, lab work & pertinent test results, reviewed documented beta blocker date and time   History of Anesthesia Complications Negative for: history of anesthetic complications  Airway Mallampati: I  TM Distance: >3 FB Neck ROM: full    Dental  (+) Implants, Dental Advidsory Given, Missing, Caps, Teeth Intact   Pulmonary neg pulmonary ROS, former smoker,           Cardiovascular Exercise Tolerance: Good hypertension, (-) angina(-) CAD, (-) Past MI, (-) Cardiac Stents and (-) CABG (-) dysrhythmias (-) Valvular Problems/Murmurs     Neuro/Psych PSYCHIATRIC DISORDERS Anxiety Depression negative neurological ROS     GI/Hepatic negative GI ROS, Neg liver ROS,   Endo/Other  negative endocrine ROS  Renal/GU negative Renal ROS  negative genitourinary   Musculoskeletal   Abdominal   Peds  Hematology negative hematology ROS (+)   Anesthesia Other Findings Past Medical History: No date: Allergy No date: Anxiety     Comment:  was on klonopin, wellbutrin, prozac now off no restart  No date: Arthritis     Comment:  hands  No date: Depression No date: Hypothyroidism   Reproductive/Obstetrics negative OB ROS                             Anesthesia Physical Anesthesia Plan  ASA: II  Anesthesia Plan: General   Post-op Pain Management:    Induction: Intravenous  PONV Risk Score and Plan: 3 and Propofol infusion and TIVA  Airway Management Planned: Nasal Cannula and Natural Airway  Additional Equipment:   Intra-op Plan:   Post-operative Plan:   Informed Consent: I have reviewed the patients History and Physical, chart, labs and discussed the procedure including the risks, benefits and alternatives for the proposed anesthesia with the patient  or authorized representative who has indicated his/her understanding and acceptance.   Dental Advisory Given  Plan Discussed with: Anesthesiologist, CRNA and Surgeon  Anesthesia Plan Comments:         Anesthesia Quick Evaluation

## 2018-07-23 NOTE — Transfer of Care (Signed)
Immediate Anesthesia Transfer of Care Note  Patient: Nicole Mccarty  Procedure(s) Performed: COLONOSCOPY WITH PROPOFOL (N/A )  Patient Location: PACU and Endoscopy Unit  Anesthesia Type:General  Level of Consciousness: awake, alert , oriented and patient cooperative  Airway & Oxygen Therapy: Patient Spontanous Breathing  Post-op Assessment: Report given to RN and Post -op Vital signs reviewed and stable  Post vital signs: Reviewed and stable  Last Vitals:  Vitals Value Taken Time  BP    Temp    Pulse    Resp    SpO2      Last Pain:  Vitals:   07/23/18 0957  TempSrc: Tympanic         Complications: No apparent anesthesia complications

## 2018-07-23 NOTE — H&P (Signed)
Lucilla Lame, MD [facility] [address], [place], [place] [address] Phone: [phone] Fax : [phone]  Primary Care Physician:  McLean-Scocuzza, Nino Glow, MD Primary Gastroenterologist:  Dr. Allen Norris  Pre-Procedure History & Physical: HPI:  Nicole Mccarty is a 52 y.o. female is here for a screening colonoscopy.   Past Medical History:  Diagnosis Date  . Allergy   . Anxiety    was on klonopin, wellbutrin, prozac now off no restart   . Arthritis    hands   . Depression   . Hypothyroidism     Past Surgical History:  Procedure Laterality Date  . APPENDECTOMY     1979    Prior to Admission medications   Medication Sig Start Date End Date Taking? Authorizing Provider  CALCIUM/MAGNESIUM/ZINC FORMULA PO Take by mouth daily.    [provider]  cetirizine (ZYRTEC) 10 MG tablet Take 10 mg by mouth daily.    [provider]  Nyoka Cowden Tea 150 MG CAPS Take by mouth daily.     [provider]  hydrochlorothiazide (HYDRODIURIL) 12.5 MG tablet Take 1 tablet (12.5 mg total) by mouth daily. In am 07/05/18   McLean-Scocuzza, Nino Glow, MD  Multiple Vitamins-Minerals (MULTIVITAMIN ADULT) CHEW Chew by mouth.    [provider]  Na Sulfate-K Sulfate-Mg Sulf (SUPREP BOWEL PREP KIT) 17.5-3.13-1.6 GM/177ML SOLN Take 1 kit by mouth as directed. 07/19/18   Lucilla Lame, MD    Allergies as of 07/05/2018  . (No Known Allergies)    Family History  Problem Relation Age of Onset  . Arthritis Mother   . Cancer Mother        mouth cancer roof of mouth  . Hearing loss Mother   . Heart disease Mother   . Kidney disease Mother   . Hypertension Mother   . Cancer Father        melanoma  . Diabetes Father   . Hearing loss Father   . Heart disease Father   . Hypertension Father   . Hyperlipidemia Father   . Depression Sister   . Hyperlipidemia Sister   . Arthritis Brother   . Depression Brother   . Hyperlipidemia Brother   . Hypertension Brother     Social  History   Socioeconomic History  . Marital status: Single    Spouse name: Not on file  . Number of children: Not on file  . Years of education: Not on file  . Highest education level: Not on file  Occupational History  . Not on file  Social Needs  . Financial resource strain: Not on file  . Food insecurity:    Worry: Not on file    Inability: Not on file  . Transportation needs:    Medical: Not on file    Non-medical: Not on file  Tobacco Use  . Smoking status: Former Research scientist (life sciences)  . Smokeless tobacco: Never Used  . Tobacco comment: smoked in 180s x 6-7 years light   Substance and Sexual Activity  . Alcohol use: Yes    Alcohol/week: 1.0 standard drinks    Types: 1 Glasses of wine per week    Comment: 1 beer per day  . Drug use: Not Currently  . Sexual activity: Not Currently    Comment: men  Lifestyle  . Physical activity:    Days per week: Not on file    Minutes per session: Not on file  . Stress: Not on file  Relationships  . Social connections:  Talks on phone: Not on file    Gets together: Not on file    Attends religious service: Not on file    Active member of club or organization: Not on file    Attends meetings of clubs or organizations: Not on file    Relationship status: Not on file  . Intimate partner violence:    Fear of current or ex partner: Not on file    Emotionally abused: Not on file    Physically abused: Not on file    Forced sexual activity: Not on file  Other Topics Concern  . 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 Neck:  Supple; no masses or thyromegaly. Lungs:  Clear throughout to auscultation.    Heart:  Regular rate and rhythm. Abdomen:  Soft, nontender and nondistended. Normal bowel sounds, without guarding, and without rebound.   Neurologic:  Alert and  oriented x4;  grossly normal neurologically.  Impression/Plan: Nicole Mccarty is now here to undergo a screening colonoscopy.  Risks, benefits, and alternatives regarding colonoscopy have been reviewed with the patient.  Questions have been answered.  All parties agreeable.

## 2018-07-23 NOTE — Anesthesia Post-op Follow-up Note (Signed)
Anesthesia QCDR form completed.        

## 2018-07-24 ENCOUNTER — Encounter: Payer: Self-pay | Admitting: Gastroenterology

## 2018-07-24 NOTE — Anesthesia Postprocedure Evaluation (Addendum)
Anesthesia Post Note  Patient: Nicole Mccarty  Procedure(s) Performed: COLONOSCOPY WITH PROPOFOL (N/A )  Patient location during evaluation: Endoscopy Anesthesia Type: General Level of consciousness: awake and alert Pain management: pain level controlled Vital Signs Assessment: post-procedure vital signs reviewed and stable Respiratory status: spontaneous breathing, nonlabored ventilation, respiratory function stable and patient connected to nasal cannula oxygen Cardiovascular status: blood pressure returned to baseline and stable Postop Assessment: no apparent nausea or vomiting Anesthetic complications: no     Last Vitals:  Vitals:   07/23/18 0957 07/23/18 1135  BP: 108/83 121/80  Pulse: 74 81  Resp: 18 17  Temp: (!) 35.9 C 36.4 C  SpO2: 100% 99%    Last Pain:  Vitals:   07/23/18 1135  TempSrc: Tympanic                 Martha Clan

## 2018-07-26 LAB — SURGICAL PATHOLOGY

## 2018-07-28 ENCOUNTER — Encounter: Payer: Self-pay | Admitting: Gastroenterology

## 2018-08-06 ENCOUNTER — Ambulatory Visit (INDEPENDENT_AMBULATORY_CARE_PROVIDER_SITE_OTHER): Payer: BLUE CROSS/BLUE SHIELD | Admitting: Internal Medicine

## 2018-08-06 ENCOUNTER — Encounter: Payer: Self-pay | Admitting: Internal Medicine

## 2018-08-06 ENCOUNTER — Ambulatory Visit
Admission: RE | Admit: 2018-08-06 | Discharge: 2018-08-06 | Disposition: A | Discharge status: Home / Self Care | Payer: BLUE CROSS/BLUE SHIELD | Source: Ambulatory Visit | Attending: Internal Medicine | Admitting: Internal Medicine

## 2018-08-06 ENCOUNTER — Other Ambulatory Visit: Payer: Self-pay | Admitting: Internal Medicine

## 2018-08-06 VITALS — BP 126/80 | HR 83 | Temp 98.2°F | Ht 65.0 in | Wt 158.0 lb

## 2018-08-06 DIAGNOSIS — M1990 Unspecified osteoarthritis, unspecified site: Secondary | ICD-10-CM

## 2018-08-06 DIAGNOSIS — I10 Essential (primary) hypertension: Secondary | ICD-10-CM | POA: Diagnosis not present

## 2018-08-06 DIAGNOSIS — Z1231 Encounter for screening mammogram for malignant neoplasm of breast: Secondary | ICD-10-CM | POA: Insufficient documentation

## 2018-08-06 DIAGNOSIS — K635 Polyp of colon: Secondary | ICD-10-CM | POA: Diagnosis not present

## 2018-08-06 DIAGNOSIS — E2839 Other primary ovarian failure: Secondary | ICD-10-CM

## 2018-08-06 DIAGNOSIS — R928 Other abnormal and inconclusive findings on diagnostic imaging of breast: Secondary | ICD-10-CM

## 2018-08-06 DIAGNOSIS — F419 Anxiety disorder, unspecified: Secondary | ICD-10-CM

## 2018-08-06 MED ORDER — MELOXICAM 7.5 MG PO TABS: 7.5000 mg | ORAL_TABLET | Freq: Every day | ORAL | 2 refills | Status: DC

## 2018-08-06 NOTE — Patient Instructions (Addendum)
L theanine supplement sleep, stress, anxiety  Replika app for smart phone  Aromatherapy Lavender  Deep breathing   Vitamin D3 1000-2000 IU 1x per day  Calcium 600 mg 1-2 xper day total 1200 mg daily   Tumeric can help with joint pain and glucosamine and chondroitin  Rheumatology  -Dr. Tomasa Blase [place] [phone] reason inflammatory arthritis and diffuse joint    Results for Moye, [facility] (MRN [id number]) as of 08/06/2018 11:40  Ref. Range 07/05/2018 10:07  Sodium Latest Ref Range: 135 - 145 mEq/L 139  Potassium Latest Ref Range: 3.5 - 5.1 mEq/L 4.6  Chloride Latest Ref Range: 96 - 112 mEq/L 103  CO2 Latest Ref Range: 19 - 32 mEq/L 29  Glucose Latest Ref Range: 70 - 99 mg/dL 102 (H)  BUN Latest Ref Range: 6 - 23 mg/dL 13  Creatinine Latest Ref Range: 0.40 - 1.20 mg/dL 0.63  Calcium Latest Ref Range: 8.4 - 10.5 mg/dL 9.9  Alkaline Phosphatase Latest Ref Range: 39 - 117 U/L 53  Albumin Latest Ref Range: 3.5 - 5.2 g/dL 4.4  Uric Acid, Serum Latest Ref Range: 2.4 - 7.0 mg/dL 3.6  AST Latest Ref Range: 0 - 37 U/L 13  ALT Latest Ref Range: 0 - 35 U/L 11  Total Protein Latest Ref Range: 6.0 - 8.3 g/dL 7.0  Total Bilirubin Latest Ref Range: 0.2 - 1.2 mg/dL 0.4  GFR Latest Ref Range: >60.00 mL/min 104.97  Total CHOL/HDL Ratio Unknown 3  Cholesterol Latest Ref Range: 0 - 200 mg/dL 188  HDL Cholesterol Latest Ref Range: >39.00 mg/dL 71.80  LDL (calc) Latest Ref Range: 0 - 99 mg/dL 103 (H)  NonHDL Unknown 116.50  Triglycerides Latest Ref Range: 0.0 - 149.0 mg/dL 70.0  VLDL Latest Ref Range: 0.0 - 40.0 mg/dL 14.0  CRP Latest Ref Range: 0.5 - 20.0 mg/dL 0.1 (L)  VITD Latest Ref Range: 30.00 - 100.00 ng/mL 39.12   Results for [place], [facility] (MRN [id number]) as of 08/06/2018 11:40  Ref. Range 07/05/2018 10:07  WBC Latest Ref Range: 4.0 - 10.5 K/uL 5.8  RBC Latest Ref Range: 3.87 - 5.11 Mil/uL 4.19  Hemoglobin Latest Ref Range: 12.0 - 15.0 g/dL 13.2  HCT Latest Ref Range: 36.0 -  46.0 % 39.5  MCV Latest Ref Range: 78.0 - 100.0 fl 94.3  MCHC Latest Ref Range: 30.0 - 36.0 g/dL 33.3  RDW Latest Ref Range: 11.5 - 15.5 % 13.1  Platelets Latest Ref Range: 150.0 - 400.0 K/uL 342.0  Neutrophils Latest Ref Range: 43.0 - 77.0 % 59.5  Lymphocytes Latest Ref Range: 12.0 - 46.0 % 30.1  Monocytes Relative Latest Ref Range: 3.0 - 12.0 % 7.1  Eosinophil Latest Ref Range: 0.0 - 5.0 % 2.3  Basophil Latest Ref Range: 0.0 - 3.0 % 1.0  NEUT# Latest Ref Range: 1.4 - 7.7 K/uL 3.4  Lymphocyte # Latest Ref Range: 0.7 - 4.0 K/uL 1.7  Monocyte # Latest Ref Range: 0.1 - 1.0 K/uL 0.4  Eosinophils Absolute Latest Ref Range: 0.0 - 0.7 K/uL 0.1  Basophils Absolute Latest Ref Range: 0.0 - 0.1 K/uL 0.1  Sed Rate Latest Ref Range: 0 - 30 mm/hr 2  Results for Fleek, [facility] (MRN [id number]) as of 08/06/2018 11:40  Ref. Range 07/05/2018 10:07  Sed Rate Latest Ref Range: 0 - 30 mm/hr 2   Results for [place], [facility] (MRN [id number]) as of 08/06/2018 11:40  Ref. Range 07/05/2018 10:07  Glucose Latest Ref Range: 70 - 99 mg/dL 102 (H)  TSH Latest Ref Range: 0.35 - 4.50 uIU/mL 1.73  T4,Free(Direct) Latest Ref Range: 0.60 - 1.60 ng/dL 0.81  Thyroperoxidase Ab SerPl-aCnc Latest Ref Range: <9 IU/mL <1    Results for Kerchner, [facility] (MRN [id number]) as of 08/06/2018 11:40  Ref. Range 07/05/2018 10:07  Anti Nuclear Antibody(ANA) Latest Ref Range: NEGATIVE  NEGATIVE  Cyclic Citrullin Peptide Ab Latest Units: UNITS <16  RA Latex Turbid. Latest Ref Range: <14 IU/mL <14   Results for Allard, [facility] (MRN [id number]) as of 08/06/2018 11:40  Ref. Range 07/05/2018 10:07  Appearance Latest Ref Range: CLEAR  CLEAR  Bilirubin Urine Latest Ref Range: NEGATIVE  NEGATIVE  Color, Urine Latest Ref Range: YELLOW  YELLOW  Glucose, UA Latest Ref Range: NEGATIVE  NEGATIVE  Hgb urine dipstick Latest Ref Range: NEGATIVE  NEGATIVE  Ketones, ur Latest Ref Range: NEGATIVE  NEGATIVE  Leukocytes, UA Latest Ref Range: NEGATIVE  NEGATIVE  Nitrite  Latest Ref Range: NEGATIVE  NEGATIVE  pH Latest Ref Range: 5.0 - 8.0  7.0  Protein Latest Ref Range: NEGATIVE  NEGATIVE  Specific Gravity, Urine Latest Ref Range: 1.001 - 1.03  1.005    Arthritis Arthritis is a term that is commonly used to refer to joint pain or joint disease. There are more than 100 types of arthritis. What are the causes? The most common cause of this condition is wear and tear of a joint. Other causes include:  Gout.  Inflammation of a joint.  An infection of a joint.  Sprains and other injuries near the joint.  A drug reaction or allergic reaction.  In some cases, the cause may not be known. What are the signs or symptoms? The main symptom of this condition is pain in the joint with movement. Other symptoms include:  Redness, swelling, or stiffness at a joint.  Warmth coming from the joint.  Fever.  Overall feeling of illness.  How is this diagnosed? This condition may be diagnosed with a physical exam and tests, including:  Blood tests.  Urine tests.  Imaging tests, such as MRI, X-rays, or a CT scan.  Sometimes, fluid is removed from a joint for testing. How is this treated? Treatment for this condition may involve:  Treatment of the cause, if it is known.  Rest.  Raising (elevating) the joint.  Applying cold or hot packs to the joint.  Medicines to improve symptoms and reduce inflammation.  Injections of a steroid such as cortisone into the joint to help reduce pain and inflammation.  Depending on the cause of your arthritis, you may need to make lifestyle changes to reduce stress on your joint. These changes may include exercising more and losing weight. Follow these instructions at home: Medicines  Take over-the-counter and prescription medicines only as told by your health care provider.  Do not take aspirin to relieve pain if gout is suspected. Activity  Rest your joint if told by your health care provider. Rest is  important when your disease is active and your joint feels painful, swollen, or stiff.  Avoid activities that make the pain worse. It is important to balance activity with rest.  Exercise your joint regularly with range-of-motion exercises as told by your health care provider. Try doing low-impact exercise, such as: ? Swimming. ? Water aerobics. ? Biking. ? Walking. Joint Care   If your joint is swollen, keep it elevated if told by your health care provider.  If your joint feels stiff in the morning, try taking a warm shower.  If directed, apply heat to the joint. If you have diabetes, do not apply heat without permission from your health care provider. ? Put a towel between the joint and the hot pack or heating pad. ? Leave the heat on the area for 20-30 minutes.  If directed, apply ice to the joint: ? Put ice in a plastic bag. ? Place a towel between your skin and the bag. ? Leave the ice on for 20 minutes, 2-3 times per day.  Keep all follow-up visits as told by your health care provider. This is important. Contact a health care provider if:  The pain gets worse.  You have a fever. Get help right away if:  You develop severe joint pain, swelling, or redness.  Many joints become painful and swollen.  You develop severe back pain.  You develop severe weakness in your leg.  You cannot control your bladder or bowels. This information is not intended to replace advice given to you by your health care provider. Make sure you discuss any questions you have with your health care provider. Document Released: 09/21/2004 Document Revised: 01/20/2016 Document Reviewed: 11/09/2014 Elsevier Interactive Patient Education  [employer].

## 2018-08-06 NOTE — Progress Notes (Addendum)
Chief Complaint  Patient presents with  . Follow-up   F/u  1. BP improved off hctz 12.5 due to medication caused jt aches and pains made them worse  2. Anxiety she declines medications as she was on them in the past  3. Joint pain in right elbow, left great toe and b/l hands Xray left hand osteopenia and c/w inflammatory arthritis labs seronegative RA w/u will refer to rheumatology. C/o right ring finger trigger finger swelling and pain. No medications tried  4. Reviewed labs  5. Colonoscopy polyps hyperplastic x 2 on colonoscopy 2019 f/u in 5 years   Review of Systems  Constitutional: Negative for weight loss.  HENT: Negative for hearing loss.   Eyes: Negative for blurred vision.  Respiratory: Negative for shortness of breath.   Cardiovascular: Negative for chest pain.  Gastrointestinal: Negative for abdominal pain.  Musculoskeletal: Positive for joint pain.  Skin: Negative for rash.  Neurological: Negative for headaches.  Psychiatric/Behavioral: The patient is nervous/anxious.    Past Medical History:  Diagnosis Date  . Allergy   . Anxiety    was on klonopin, wellbutrin, prozac now off no restart   . Arthritis    hands   . Depression   . Hypothyroidism    Past Surgical History:  Procedure Laterality Date  . APPENDECTOMY     1979  . COLONOSCOPY WITH PROPOFOL N/A 07/23/2018   Procedure: COLONOSCOPY WITH PROPOFOL;  Surgeon: Lucilla Lame, MD;  Location: [facility] ENDOSCOPY;  Service: Endoscopy;  Laterality: N/A;   Family History  Problem Relation Age of Onset  . Arthritis Mother   . Cancer Mother        mouth cancer roof of mouth  . Hearing loss Mother   . Heart disease Mother   . Kidney disease Mother   . Hypertension Mother   . Cancer Father        melanoma  . Diabetes Father   . Hearing loss Father   . Heart disease Father   . Hypertension Father   . Hyperlipidemia Father   . Depression Sister   . Hyperlipidemia Sister   . Arthritis Brother   . Depression  Brother   . Hyperlipidemia Brother   . Hypertension Brother    Social History   Socioeconomic History  . Marital status: Single    Spouse name: Not on file  . Number of children: Not on file  . Years of education: Not on file  . Highest education level: Not on file  Occupational History  . Not on file  Social Needs  . Financial resource strain: Not on file  . Food insecurity:    Worry: Not on file    Inability: Not on file  . Transportation needs:    Medical: Not on file    Non-medical: Not on file  Tobacco Use  . Smoking status: Former Research scientist (life sciences)  . Smokeless tobacco: Never Used  . Tobacco comment: smoked in 180s x 6-7 years light   Substance and Sexual Activity  . Alcohol use: Yes    Alcohol/week: 1.0 standard drinks    Types: 1 Glasses of wine per week    Comment: 1 beer per day  . Drug use: Not Currently  . Sexual activity: Not Currently    Comment: men  Lifestyle  . Physical activity:    Days per week: Not on file    Minutes per session: Not on file  . Stress: Not on file  Relationships  . Social connections:  Talks on phone: Not on file    Gets together: Not on file    Attends religious service: Not on file    Active member of club or organization: Not on file    Attends meetings of clubs or organizations: Not on file    Relationship status: Not on file  . Intimate partner violence:    Fear of current or ex partner: Not on file    Emotionally abused: Not on file    Physically abused: Not on file    Forced sexual activity: Not on file  Other Topics Concern  . 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 CALCIUM/MAGNESIUM/ZINC FORMULA PO Take by mouth daily.  . cetirizine (ZYRTEC) 10 MG tablet Take 10 mg by mouth daily.  Nyoka Cowden Tea 150 MG CAPS Take by mouth daily.   . Multiple  Vitamins-Minerals (MULTIVITAMIN ADULT) CHEW Chew by mouth.  . Na Sulfate-K Sulfate-Mg Sulf (SUPREP BOWEL PREP KIT) 17.5-3.13-1.6 GM/177ML SOLN Take 1 kit by mouth as directed.   No Known Allergies Recent Results (from the past 2160 hour(s))  Comprehensive metabolic panel     Status: Abnormal   Collection Time: 07/05/18 10:07 AM  Result Value Ref Range   Sodium 139 135 - 145 mEq/L   Potassium 4.6 3.5 - 5.1 mEq/L   Chloride 103 96 - 112 mEq/L   CO2 29 19 - 32 mEq/L   Glucose, Bld 102 (H) 70 - 99 mg/dL   BUN 13 6 - 23 mg/dL   Creatinine, Ser 0.63 0.40 - 1.20 mg/dL   Total Bilirubin 0.4 0.2 - 1.2 mg/dL   Alkaline Phosphatase 53 39 - 117 U/L   AST 13 0 - 37 U/L   ALT 11 0 - 35 U/L   Total Protein 7.0 6.0 - 8.3 g/dL   Albumin 4.4 3.5 - 5.2 g/dL   Calcium 9.9 8.4 - 10.5 mg/dL   GFR 104.97 >60.00 mL/min  CBC with Differential/Platelet     Status: None   Collection Time: 07/05/18 10:07 AM  Result Value Ref Range   WBC 5.8 4.0 - 10.5 K/uL   RBC 4.19 3.87 - 5.11 Mil/uL   Hemoglobin 13.2 12.0 - 15.0 g/dL   HCT 39.5 36.0 - 46.0 %   MCV 94.3 78.0 - 100.0 fl   MCHC 33.3 30.0 - 36.0 g/dL   RDW 13.1 11.5 - 15.5 %   Platelets 342.0 150.0 - 400.0 K/uL   Neutrophils Relative % 59.5 43.0 - 77.0 %   Lymphocytes Relative 30.1 12.0 - 46.0 %   Monocytes Relative 7.1 3.0 - 12.0 %   Eosinophils Relative 2.3 0.0 - 5.0 %   Basophils Relative 1.0 0.0 - 3.0 %   Neutro Abs 3.4 1.4 - 7.7 K/uL   Lymphs Abs 1.7 0.7 - 4.0 K/uL   Monocytes Absolute 0.4 0.1 - 1.0 K/uL   Eosinophils Absolute 0.1 0.0 - 0.7 K/uL   Basophils Absolute 0.1 0.0 - 0.1 K/uL  TSH     Status: None   Collection Time: 07/05/18 10:07 AM  Result Value Ref Range   TSH 1.73 0.35 - 4.50 uIU/mL  T4, free     Status: None  Collection Time: 07/05/18 10:07 AM  Result Value Ref Range   Free T4 0.81 0.60 - 1.60 ng/dL    Comment: Specimens from patients who are undergoing biotin therapy and /or ingesting biotin supplements may contain high  levels of biotin.  The higher biotin concentration in these specimens interferes with this Free T4 assay.  Specimens that contain high levels  of biotin may cause false high results for this Free T4 assay.  Please interpret results in light of the total clinical presentation of the patient.    Thyroid peroxidase antibody     Status: None   Collection Time: 07/05/18 10:07 AM  Result Value Ref Range   Thyroperoxidase Ab SerPl-aCnc <1 <9 IU/mL  Urinalysis, Routine w reflex microscopic     Status: None   Collection Time: 07/05/18 10:07 AM  Result Value Ref Range   Color, Urine YELLOW YELLOW   APPearance CLEAR CLEAR   Specific Gravity, Urine 1.005 1.001 - 1.03   pH 7.0 5.0 - 8.0   Glucose, UA NEGATIVE NEGATIVE   Bilirubin Urine NEGATIVE NEGATIVE   Ketones, ur NEGATIVE NEGATIVE   Hgb urine dipstick NEGATIVE NEGATIVE   Protein, ur NEGATIVE NEGATIVE   Nitrite NEGATIVE NEGATIVE   Leukocytes, UA NEGATIVE NEGATIVE  Lipid panel     Status: Abnormal   Collection Time: 07/05/18 10:07 AM  Result Value Ref Range   Cholesterol 188 0 - 200 mg/dL    Comment: ATP III Classification       Desirable:  < 200 mg/dL               Borderline High:  200 - 239 mg/dL          High:  > = 240 mg/dL   Triglycerides 70.0 0.0 - 149.0 mg/dL    Comment: Normal:  <150 mg/dLBorderline High:  150 - 199 mg/dL   HDL 71.80 >39.00 mg/dL   VLDL 14.0 0.0 - 40.0 mg/dL   LDL Cholesterol 103 (H) 0 - 99 mg/dL   Total CHOL/HDL Ratio 3     Comment:                Men          Women1/2 Average Risk     3.4          3.3Average Risk          5.0          4.42X Average Risk          9.6          7.13X Average Risk          15.0          11.0                       NonHDL 116.50     Comment: NOTE:  Non-HDL goal should be 30 mg/dL higher than patient's LDL goal (i.e. LDL goal of < 70 mg/dL, would have non-HDL goal of < 100 mg/dL)  Vitamin D (25 hydroxy)     Status: None   Collection Time: 07/05/18 10:07 AM  Result Value Ref Range    VITD 39.12 30.00 - 100.00 ng/mL  Uric acid     Status: None   Collection Time: 07/05/18 10:07 AM  Result Value Ref Range   Uric Acid, Serum 3.6 2.4 - 7.0 mg/dL  Antinuclear Antib (ANA)     Status: None   Collection Time: 07/05/18 10:07 AM  Result Value Ref Range   Anti Nuclear Antibody(ANA) NEGATIVE NEGATIVE    Comment: ANA IFA is a first line screen for detecting the presence of up to approximately 150 autoantibodies in various autoimmune diseases. A negative ANA IFA result suggests an ANA-associated autoimmune disease is not present at this time, but is not definitive. If there is high clinical suspicion for Sjogren's syndrome, testing for anti-SS-A/Ro antibody should be considered. Anti-Jo-1 antibody should be considered for clinically suspected inflammatory myopathies. . AC-0: Negative . International Consensus on ANA Patterns (https://www.hernandez-brewer.com/) . For additional information, please refer to http://education.[facility].com/faq/FAQ177 (This link is being provided for informational/ educational purposes only.) .   Rheumatoid Factor     Status: None   Collection Time: 07/05/18 10:07 AM  Result Value Ref Range   Rhuematoid fact SerPl-aCnc <57 <32 IU/mL  Cyclic citrul peptide antibody, IgG ([facility])     Status: None   Collection Time: 07/05/18 10:07 AM  Result Value Ref Range   Cyclic Citrullin Peptide Ab <16 UNITS    Comment: Reference Range Negative:            <20 Weak Positive:       20-39 Moderate Positive:   40-59 Strong Positive:     >59 .   Sedimentation rate     Status: None   Collection Time: 07/05/18 10:07 AM  Result Value Ref Range   Sed Rate 2 0 - 30 mm/hr  C-reactive protein     Status: Abnormal   Collection Time: 07/05/18 10:07 AM  Result Value Ref Range   CRP 0.1 (L) 0.5 - 20.0 mg/dL  Pregnancy, urine POC     Status: None   Collection Time: 07/23/18 10:08 AM  Result Value Ref Range   Preg Test, Ur NEGATIVE NEGATIVE     Comment:        THE SENSITIVITY OF THIS METHODOLOGY IS >24 mIU/mL   Surgical pathology     Status: None   Collection Time: 07/23/18 11:29 AM  Result Value Ref Range   SURGICAL PATHOLOGY      Surgical Pathology CASE: [id number] PATIENT: Nicole Mccarty Surgical Pathology Report     SPECIMEN SUBMITTED: A. Colon polyp x2, rectosigmoid; cbx  CLINICAL HISTORY: None provided  PRE-OPERATIVE DIAGNOSIS: Screening colonoscopy, family history of colon polyps  POST-OPERATIVE DIAGNOSIS: Colon polyps, diverticulosis     DIAGNOSIS: A. COLON POLYPS X2, RECTOSIGMOID; BIOPSY: - HYPERPLASTIC POLYPS (2).   GROSS DESCRIPTION: A. Labeled: Rectosigmoid colon polyp C BX x2 Received: Formalin Tissue fragment(s): 2 Size: 0.2-0.3 cm Description: Tan soft tissue fragments Entirely submitted in one cassette.   Final Diagnosis performed by Raynelle Bring, MD.   Electronically signed 07/26/2018 9:58:05AM The electronic signature indicates that the named Attending Pathologist has evaluated the specimen  Technical component performed at [facility], [address], [place], [place] [address] Lab: [phone] Dir: Rush Farmer, MD, MMM  Professional component performed at  [facility], [facility], [place], [place], [place] [address] Lab: [id number] Dir: Dellia Nims. Rubinas, MD    Objective  Body mass index is 26.29 kg/m. Wt Readings from Last 3 Encounters:  08/06/18 158 lb (71.7 kg)  07/23/18 154 lb (69.9 kg)  07/05/18 157 lb (71.2 kg)   Temp Readings from Last 3 Encounters:  08/06/18 98.2 F (36.8 C) (Oral)  07/23/18 97.6 F (36.4 C) (Tympanic)  07/05/18 98 F (36.7 C) (Oral)   BP Readings from Last 3 Encounters:  08/06/18 126/80  07/23/18 121/80  07/05/18 (!) 140/98  Pulse Readings from Last 3 Encounters:  08/06/18 83  07/23/18 81  07/05/18 72    Physical Exam  Constitutional: She is oriented to person, place, and time. Vital signs are normal. She  appears well-developed and well-nourished. She is cooperative.  HENT:  Head: Normocephalic and atraumatic.  Mouth/Throat: Oropharynx is clear and moist and mucous membranes are normal.  Eyes: Pupils are equal, round, and reactive to light. Conjunctivae are normal.  Cardiovascular: Normal rate, regular rhythm and normal heart sounds.  Pulmonary/Chest: Effort normal and breath sounds normal.  Musculoskeletal:  Joint swelling in hands CMC jts and MCP and PIP jts    Neurological: She is alert and oriented to person, place, and time. Gait normal.  Skin: Skin is warm, dry and intact.  Psychiatric: She has a normal mood and affect. Her speech is normal and behavior is normal. Judgment and thought content normal. Cognition and memory are normal.  Nursing note and vitals reviewed.   Assessment   1. BP improved off hctz 12.5 mg qd could be anxiety/white coat related 2. Anxiety  3. Joint pain with seroneg labs Xray l hand osteopenia and c/w inflammatory arthritis 4. HM Plan   1. Monitor BP  2. Disc otc L theanine, aromatherapy, mindfulness, replika app 3. Refer rheumatology Dr. Tomasa Blase to eval  mobic prn 7.5 bid prn  Rheum saw 09/03/2018 Marella Chimes PA HLA B27 neg, hep A igm neg, HBsAg neg, Hep B core igM neg, hep C neg quantiferon gold neg f/u in 4 weeks [place] b/l hands synovitis right wrist and left wrist and 2nd MCP left possibly erosion 3rd MCP   4.  utd flu and Tdap consider shingrix  Mammogram referred sch appt today and colonoscopy 07/23/18 hyperplastic polyps f/u in 5 years  Will do pap at f/u in 4-6 months  Derm consider in future  former smoker  Ordered DEXA  Provider: Dr. Olivia Mackie McLean-Scocuzza-[facility]

## 2018-08-06 NOTE — Progress Notes (Signed)
Pre visit review using our clinic review tool, if applicable. No additional management support is needed unless otherwise documented below in the visit note. 

## 2018-08-14 ENCOUNTER — Ambulatory Visit
Admission: RE | Admit: 2018-08-14 | Discharge: 2018-08-14 | Disposition: A | Discharge status: Home / Self Care | Payer: BLUE CROSS/BLUE SHIELD | Source: Ambulatory Visit | Attending: Internal Medicine | Admitting: Internal Medicine

## 2018-08-14 DIAGNOSIS — R928 Other abnormal and inconclusive findings on diagnostic imaging of breast: Secondary | ICD-10-CM | POA: Insufficient documentation

## 2018-09-16 ENCOUNTER — Encounter: Payer: Self-pay | Admitting: Internal Medicine

## 2018-10-29 ENCOUNTER — Other Ambulatory Visit: Payer: BLUE CROSS/BLUE SHIELD

## 2019-01-07 ENCOUNTER — Ambulatory Visit: Payer: BLUE CROSS/BLUE SHIELD | Admitting: Internal Medicine

## 2019-01-07 ENCOUNTER — Telehealth: Payer: Self-pay | Admitting: Internal Medicine

## 2019-01-07 NOTE — Telephone Encounter (Signed)
Cancel appt for 01/07/2019 today and reschedule appt for pap later in 02/2019 or after   Thanks [place]

## 2019-01-21 ENCOUNTER — Other Ambulatory Visit: Payer: BLUE CROSS/BLUE SHIELD

## 2019-02-11 ENCOUNTER — Other Ambulatory Visit: Payer: BLUE CROSS/BLUE SHIELD

## 2019-03-12 ENCOUNTER — Ambulatory Visit (INDEPENDENT_AMBULATORY_CARE_PROVIDER_SITE_OTHER): Payer: BC Managed Care – PPO | Admitting: Internal Medicine

## 2019-03-12 ENCOUNTER — Other Ambulatory Visit: Payer: Self-pay

## 2019-03-12 DIAGNOSIS — Z1329 Encounter for screening for other suspected endocrine disorder: Secondary | ICD-10-CM

## 2019-03-12 DIAGNOSIS — Z1231 Encounter for screening mammogram for malignant neoplasm of breast: Secondary | ICD-10-CM | POA: Diagnosis not present

## 2019-03-12 DIAGNOSIS — Z13220 Encounter for screening for lipoid disorders: Secondary | ICD-10-CM

## 2019-03-12 DIAGNOSIS — M1990 Unspecified osteoarthritis, unspecified site: Secondary | ICD-10-CM | POA: Diagnosis not present

## 2019-03-12 DIAGNOSIS — Z0000 Encounter for general adult medical examination without abnormal findings: Secondary | ICD-10-CM

## 2019-03-12 DIAGNOSIS — Z1389 Encounter for screening for other disorder: Secondary | ICD-10-CM

## 2019-03-12 MED ORDER — MELOXICAM 7.5 MG PO TABS: 7.5000 mg | ORAL_TABLET | Freq: Every day | ORAL | 5 refills | Status: AC

## 2019-03-12 NOTE — Progress Notes (Addendum)
Telephone Note  I connected with Nicole Mccarty   on 03/12/19 at 10:30 AM EDT by telephone and verified that I am speaking with the correct person using two identifiers.  Location patient: home Location provider:work  Persons participating in the virtual visit: patient, provider  I discussed the limitations of evaluation and management by telemedicine and the availability of in person appointments. The patient expressed understanding and agreed to proceed.   HPI: 1. Insomnia at night a times she did not try L theanine sleepy time or chamomile tea has opposite effect and does not work  2. Arthritis in hands needs refill of mobic  3. Annual    ROS: See pertinent positives and negatives per HPI. General: gained 5 lbs  HEENT: no sore throat  CV: no chest pain  Lungs: no sob  GI: no issues MSK: hand pain 2/2 arthritis  Neuro: no h/a Skin: no issues Psych: +insomnia  GU: no issues   Past Medical History:  Diagnosis Date  . Allergy   . Anxiety    was on klonopin, wellbutrin, prozac now off no restart   . Arthritis    hands   . Depression   . Hypothyroidism     Past Surgical History:  Procedure Laterality Date  . APPENDECTOMY     1979  . COLONOSCOPY WITH PROPOFOL N/A 07/23/2018   Procedure: COLONOSCOPY WITH PROPOFOL;  Surgeon: Lucilla Lame, MD;  Location: [facility] ENDOSCOPY;  Service: Endoscopy;  Laterality: N/A;    Family History  Problem Relation Age of Onset  . Arthritis Mother   . Cancer Mother        mouth cancer roof of mouth  . Hearing loss Mother   . Heart disease Mother   . Kidney disease Mother   . Hypertension Mother   . Cancer Father        melanoma  . Diabetes Father   . Hearing loss Father   . Heart disease Father   . Hypertension Father   . Hyperlipidemia Father   . Depression Sister   . Hyperlipidemia Sister   . Arthritis Brother   . Depression Brother   . Hyperlipidemia Brother   . Hypertension Brother   . Breast cancer Paternal Grandmother      SOCIAL HX: lives with mom  Works [employer]    Current Outpatient Medications:  .  CALCIUM/MAGNESIUM/ZINC FORMULA PO, Take by mouth daily., Disp: , Rfl:  .  cetirizine (ZYRTEC) 10 MG tablet, Take 10 mg by mouth daily., Disp: , Rfl:  .  Green Tea 150 MG CAPS, Take by mouth daily. , Disp: , Rfl:  .  meloxicam (MOBIC) 7.5 MG tablet, Take 1-2 tablets (7.5-15 mg total) by mouth daily., Disp: 60 tablet, Rfl: 5 .  Multiple Vitamins-Minerals (MULTIVITAMIN ADULT) CHEW, Chew by mouth., Disp: , Rfl:  .  Na Sulfate-K Sulfate-Mg Sulf (SUPREP BOWEL PREP KIT) 17.5-3.13-1.6 GM/177ML SOLN, Take 1 kit by mouth as directed., Disp: 1 Bottle, Rfl: 0  EXAM:  VITALS per patient if applicable:  GENERAL: alert, oriented, appears well and in no acute distress  PSYCH/NEURO: pleasant and cooperative, no obvious depression or anxiety, speech and thought processing grossly intact  ASSESSMENT AND PLAN:  Discussed the following assessment and plan:  Annual physical exam - Plan: fasting labs 07/08/19  utd flu and Tdap consider shingrix  Mammogram neg 08/25/19 referred again colonoscopy 07/23/18 hyperplastic polyps f/u in 5 years  pap at f/u  Derm consider in future  former smoker Ordered DEXA  Hep c neg TB neg Insomnia try L theanine   Inflammatory arthropathy - Plan: meloxicam (MOBIC) 7.5 MG tablet bid prn     I discussed the assessment and treatment plan with the patient. The patient was provided an opportunity to ask questions and all were answered. The patient agreed with the plan and demonstrated an understanding of the instructions.   The patient was advised to call back or seek an in-person evaluation if the symptoms worsen or if the condition fails to improve as anticipated.  Time spent 15 minutes Delorise Jackson, MD

## 2019-03-26 ENCOUNTER — Encounter: Payer: Self-pay | Admitting: Internal Medicine

## 2019-06-24 ENCOUNTER — Telehealth: Payer: Self-pay | Admitting: *Deleted

## 2019-06-24 NOTE — Telephone Encounter (Signed)
Copied from [place] [id number]. Topic: General - Other >> Jun 24, 2019  2:48 PM Celene Kras A wrote: Reason for CRM: Pt called and is requesting to have a mammography. Please advise.

## 2019-06-24 NOTE — Telephone Encounter (Signed)
Referral needs replacing.

## 2019-06-24 NOTE — Telephone Encounter (Signed)
Patient would like to schedule bone density. But she will need a referral hers has expired per the imaging place.

## 2019-06-24 NOTE — Telephone Encounter (Signed)
Copied from [place] [id number]. Topic: General - Other >> Jun 24, 2019  3:29 PM Mcneil, Ja-Kwan wrote: Reason for CRM: Pt stated she would like to schedule an appt for bone density test. Pt requests call back.

## 2019-06-25 ENCOUNTER — Other Ambulatory Visit: Payer: Self-pay | Admitting: Internal Medicine

## 2019-06-25 DIAGNOSIS — E2839 Other primary ovarian failure: Secondary | ICD-10-CM

## 2019-06-25 NOTE — Telephone Encounter (Signed)
Order in call [place] to schedule

## 2019-07-04 ENCOUNTER — Encounter: Payer: Self-pay | Admitting: Internal Medicine

## 2019-07-04 ENCOUNTER — Other Ambulatory Visit: Payer: Self-pay

## 2019-07-08 ENCOUNTER — Other Ambulatory Visit (INDEPENDENT_AMBULATORY_CARE_PROVIDER_SITE_OTHER): Payer: BC Managed Care – PPO

## 2019-07-08 ENCOUNTER — Other Ambulatory Visit: Payer: Self-pay

## 2019-07-08 ENCOUNTER — Other Ambulatory Visit: Payer: BC Managed Care – PPO

## 2019-07-08 DIAGNOSIS — Z1329 Encounter for screening for other suspected endocrine disorder: Secondary | ICD-10-CM | POA: Diagnosis not present

## 2019-07-08 DIAGNOSIS — Z1389 Encounter for screening for other disorder: Secondary | ICD-10-CM

## 2019-07-08 DIAGNOSIS — Z13220 Encounter for screening for lipoid disorders: Secondary | ICD-10-CM | POA: Diagnosis not present

## 2019-07-08 DIAGNOSIS — Z0000 Encounter for general adult medical examination without abnormal findings: Secondary | ICD-10-CM | POA: Diagnosis not present

## 2019-07-08 LAB — COMPREHENSIVE METABOLIC PANEL
ALT: 16 U/L (ref 0–35)
AST: 20 U/L (ref 0–37)
Albumin: 4.8 g/dL (ref 3.5–5.2)
Alkaline Phosphatase: 62 U/L (ref 39–117)
BUN: 19 mg/dL (ref 6–23)
CO2: 27 mEq/L (ref 19–32)
Calcium: 10.1 mg/dL (ref 8.4–10.5)
Chloride: 104 mEq/L (ref 96–112)
Creatinine, Ser: 0.82 mg/dL (ref 0.40–1.20)
GFR: 72.58 mL/min (ref >60.00)
Glucose, Bld: 109 mg/dL — ABNORMAL HIGH (ref 70–99)
Potassium: 3.8 mEq/L (ref 3.5–5.1)
Sodium: 140 mEq/L (ref 135–145)
Total Bilirubin: 0.4 mg/dL (ref 0.2–1.2)
Total Protein: 7.5 g/dL (ref 6.0–8.3)

## 2019-07-08 LAB — CBC WITH DIFFERENTIAL/PLATELET
Basophils Absolute: 0.1 10*3/uL (ref 0.0–0.1)
Basophils Relative: 1 % (ref 0.0–3.0)
Eosinophils Absolute: 0.1 10*3/uL (ref 0.0–0.7)
Eosinophils Relative: 1.1 % (ref 0.0–5.0)
HCT: 39.6 % (ref 36.0–46.0)
Hemoglobin: 13.2 g/dL (ref 12.0–15.0)
Lymphocytes Relative: 36.6 % (ref 12.0–46.0)
Lymphs Abs: 2 10*3/uL (ref 0.7–4.0)
MCHC: 33.3 g/dL (ref 30.0–36.0)
MCV: 95 fl (ref 78.0–100.0)
Monocytes Absolute: 0.5 10*3/uL (ref 0.1–1.0)
Monocytes Relative: 8.7 % (ref 3.0–12.0)
Neutro Abs: 2.9 10*3/uL (ref 1.4–7.7)
Neutrophils Relative %: 52.6 % (ref 43.0–77.0)
Platelets: 338 10*3/uL (ref 150.0–400.0)
RBC: 4.16 Mil/uL (ref 3.87–5.11)
RDW: 13.5 % (ref 11.5–15.5)
WBC: 5.5 10*3/uL (ref 4.0–10.5)

## 2019-07-08 LAB — LIPID PANEL
Cholesterol: 232 mg/dL — ABNORMAL HIGH (ref 0–200)
HDL: 91.9 mg/dL (ref >39.00)
LDL Cholesterol: 121 mg/dL — ABNORMAL HIGH (ref 0–99)
NonHDL: 140.28
Total CHOL/HDL Ratio: 3
Triglycerides: 96 mg/dL (ref 0.0–149.0)
VLDL: 19.2 mg/dL (ref 0.0–40.0)

## 2019-07-08 LAB — TSH: TSH: 2.77 u[IU]/mL (ref 0.35–4.50)

## 2019-07-08 LAB — T4, FREE: Free T4: 0.84 ng/dL (ref 0.60–1.60)

## 2019-07-09 LAB — URINALYSIS, ROUTINE W REFLEX MICROSCOPIC
Bilirubin Urine: NEGATIVE
Glucose, UA: NEGATIVE
Hgb urine dipstick: NEGATIVE
Nitrite: NEGATIVE
Specific Gravity, Urine: 1.03 (ref 1.001–1.03)
pH: 5.5 (ref 5.0–8.0)

## 2019-07-14 ENCOUNTER — Other Ambulatory Visit: Payer: Self-pay

## 2019-07-16 ENCOUNTER — Ambulatory Visit: Payer: BC Managed Care – PPO | Admitting: Internal Medicine

## 2019-11-11 IMAGING — MG DIGITAL SCREENING BILATERAL MAMMOGRAM WITH TOMO AND CAD
8 series · 8 of 24 positions shown · non-contrast
Comparison: None.

CLINICAL DATA: Screening.

EXAM:
DIGITAL SCREENING BILATERAL MAMMOGRAM WITH TOMO AND CAD

[R MLO synth-2D]
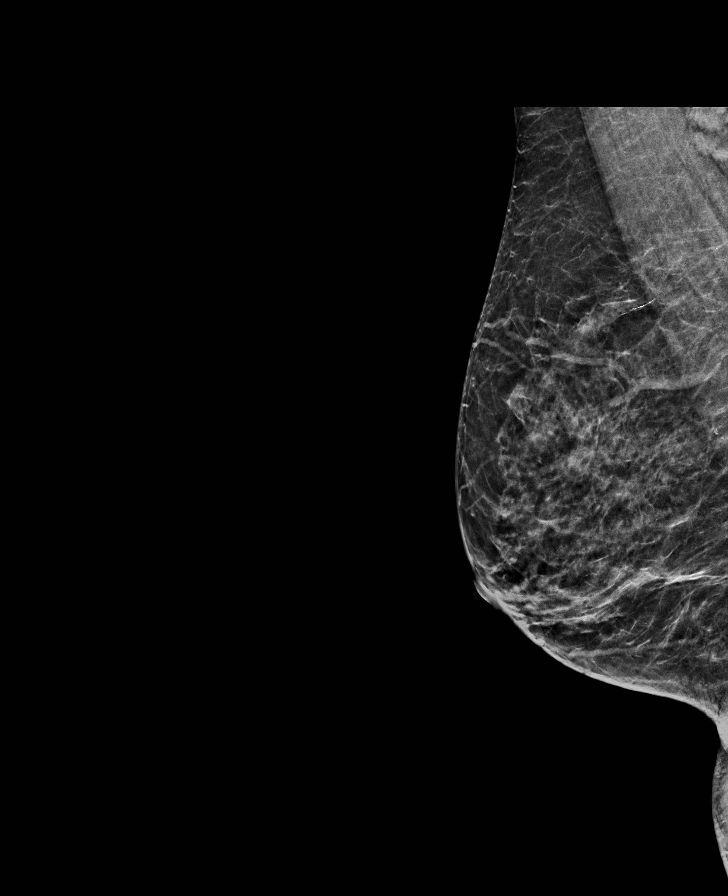

[L CC synth-2D]
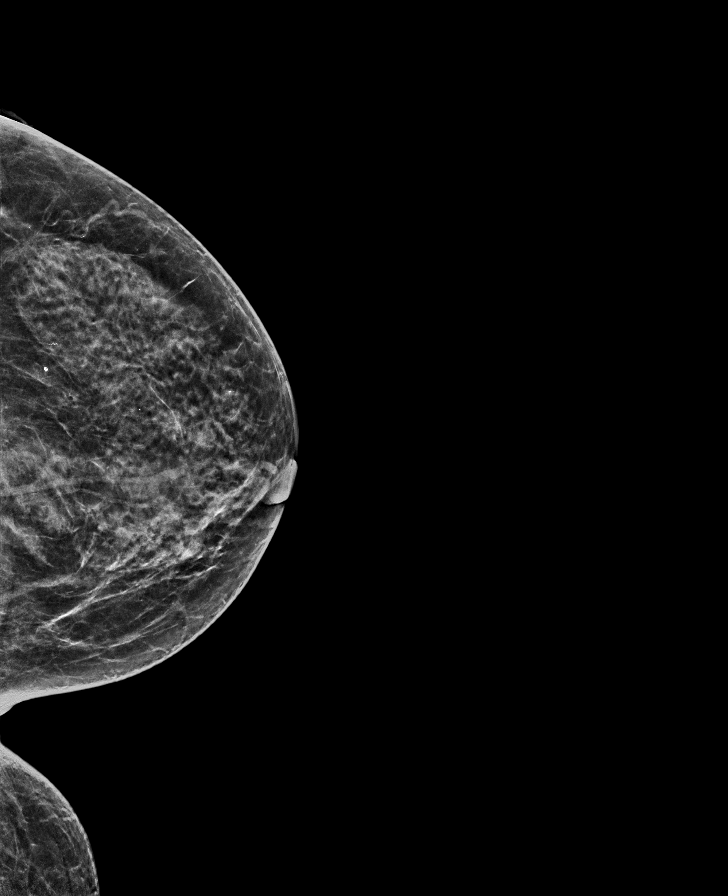

[L MLO synth-2D]
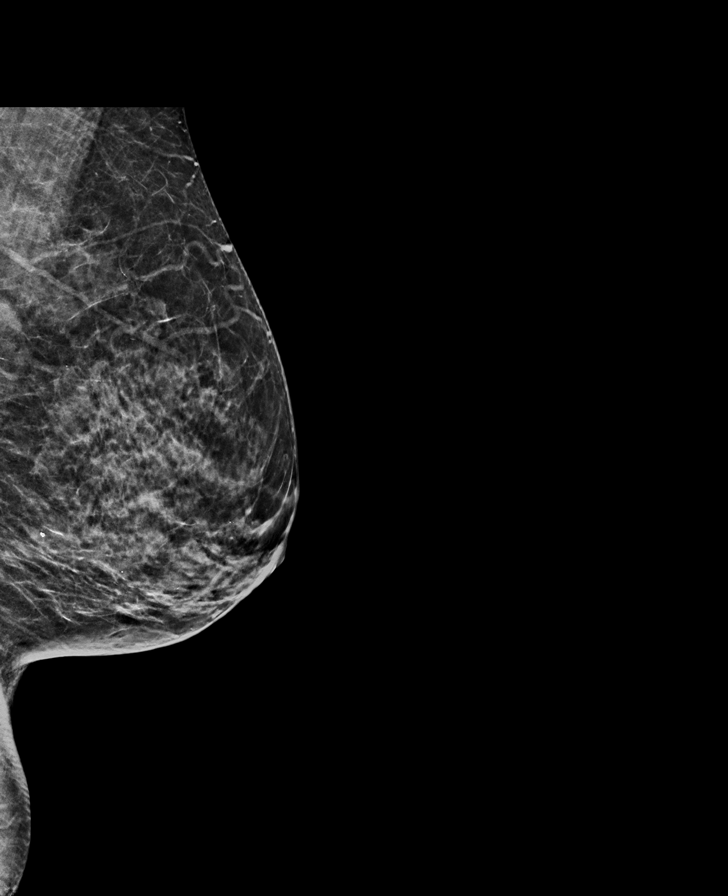

[R CC synth-2D]
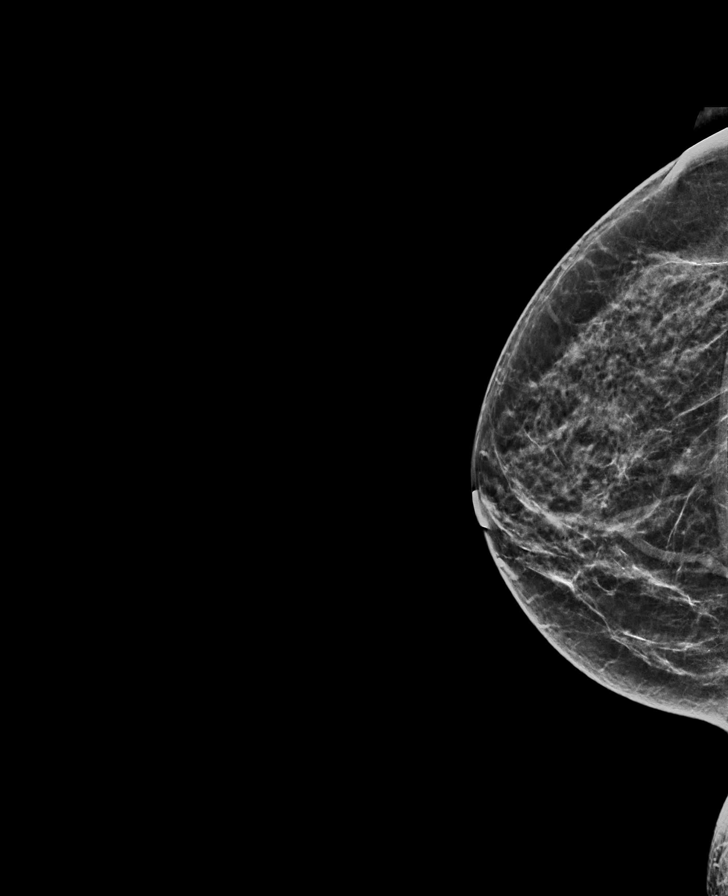

[R CC tomo · tomo slice 28/55.0]
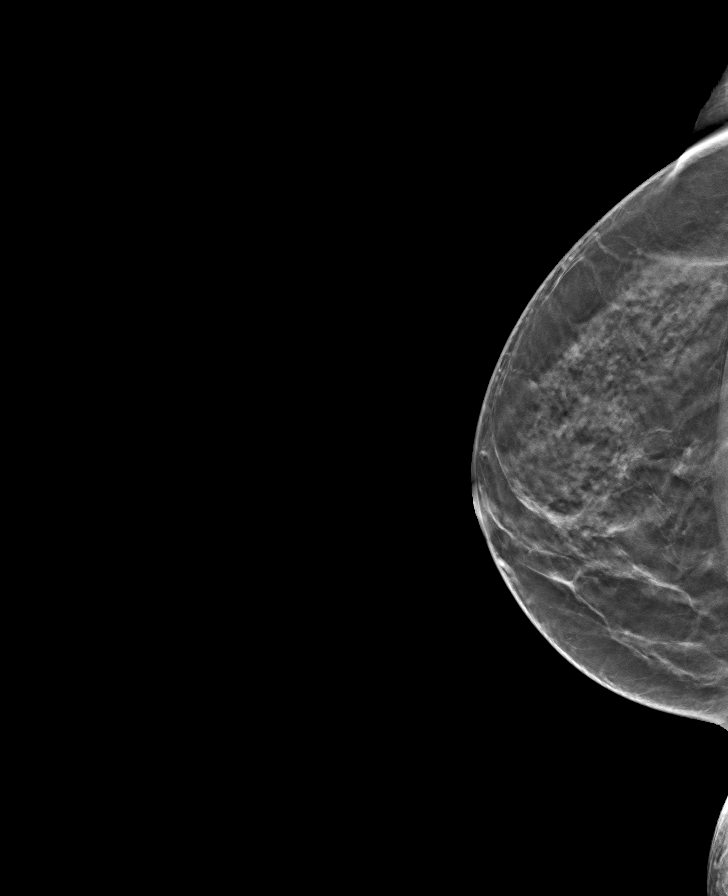

[L CC tomo · tomo slice 28/55.0]
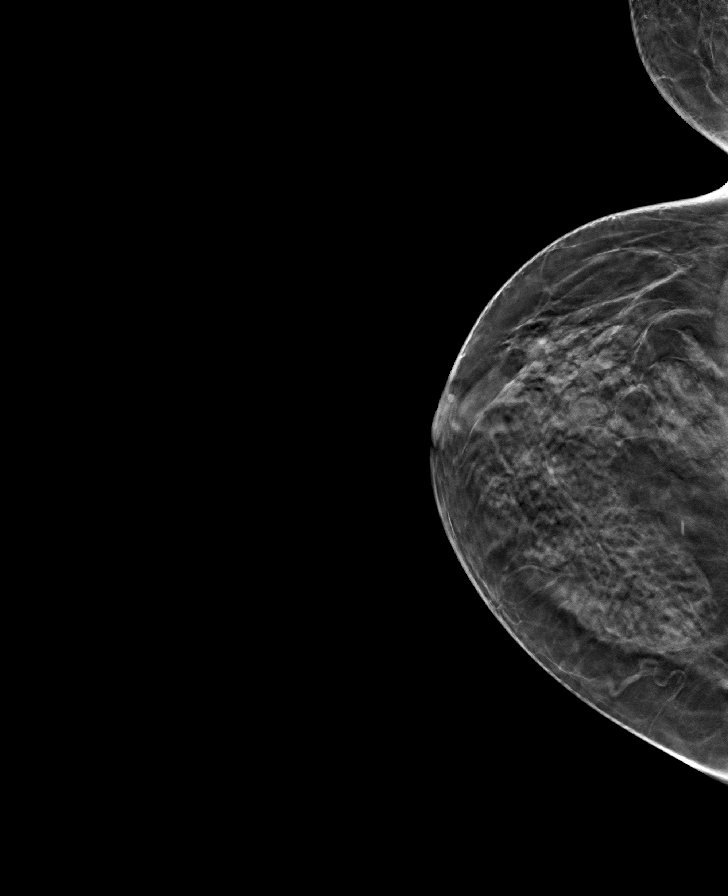

[R MLO tomo · tomo slice 28/55.0]
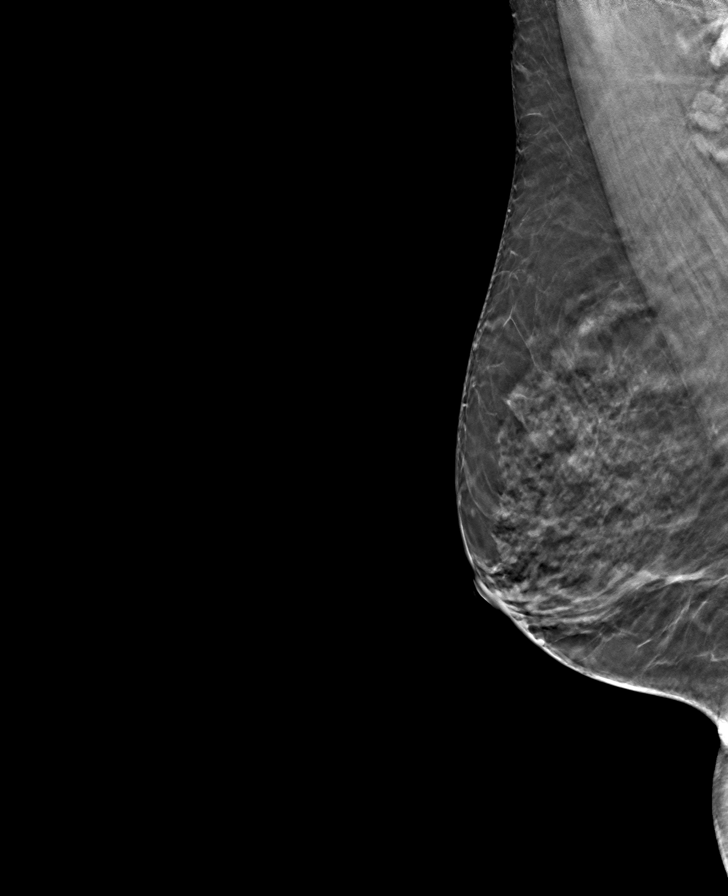

[L MLO tomo · tomo slice 27/54.0]
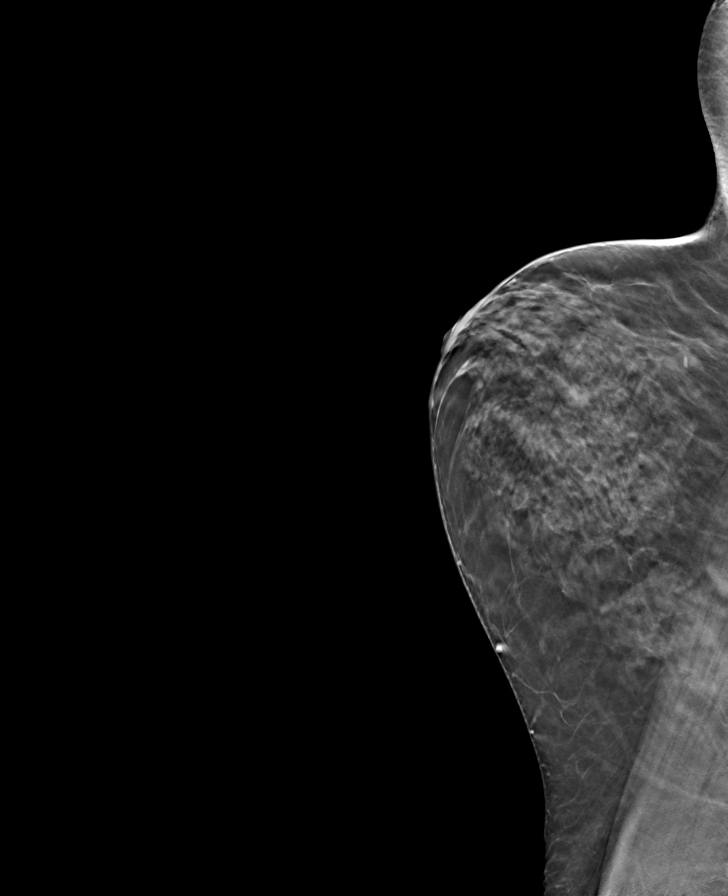

[8 of 24 positions shown; findings below may reference images not displayed]

ACR Breast Density Category c: The breast tissue is heterogeneously
dense, which may obscure small masses.
FINDINGS: In the left breast, a possible mass and an asymmetry warrants
further evaluation. In the right breast, no findings suspicious for
malignancy.

Images were processed with CAD.
IMPRESSION: Further evaluation is suggested for possible mass an asymmetry in
the left breast.

RECOMMENDATION:
Diagnostic mammogram and possibly ultrasound of the left breast.
(Code:S6-F-FFQ)

The patient will be contacted regarding the findings, and additional
imaging will be scheduled.

BI-RADS CATEGORY  0: Incomplete. Need additional imaging evaluation
and/or prior mammograms for comparison.

## 2020-02-10 ENCOUNTER — Other Ambulatory Visit: Payer: Self-pay

## 2020-02-10 ENCOUNTER — Telehealth (INDEPENDENT_AMBULATORY_CARE_PROVIDER_SITE_OTHER): Payer: BC Managed Care – PPO | Admitting: Internal Medicine

## 2020-02-10 ENCOUNTER — Encounter: Payer: Self-pay | Admitting: Internal Medicine

## 2020-02-10 VITALS — Temp 96.8°F | Ht 65.0 in | Wt 146.0 lb

## 2020-02-10 DIAGNOSIS — R05 Cough: Secondary | ICD-10-CM | POA: Diagnosis not present

## 2020-02-10 DIAGNOSIS — J4521 Mild intermittent asthma with (acute) exacerbation: Secondary | ICD-10-CM | POA: Insufficient documentation

## 2020-02-10 DIAGNOSIS — R059 Cough, unspecified: Secondary | ICD-10-CM

## 2020-02-10 MED ORDER — HYDROCOD POLST-CPM POLST ER 10-8 MG/5ML PO SUER: 5.0000 mL | Freq: Every evening | ORAL | 0 refills | Status: DC | PRN

## 2020-02-10 MED ORDER — AZITHROMYCIN 250 MG PO TABS: ORAL_TABLET | ORAL | 0 refills | Status: DC

## 2020-02-10 MED ORDER — BUDESONIDE-FORMOTEROL FUMARATE 160-4.5 MCG/ACT IN AERO: 2.0000 | INHALATION_SPRAY | Freq: Two times a day (BID) | RESPIRATORY_TRACT | 12 refills | Status: DC

## 2020-02-10 MED ORDER — ALBUTEROL SULFATE HFA 108 (90 BASE) MCG/ACT IN AERS: 1.0000 | INHALATION_SPRAY | Freq: Four times a day (QID) | RESPIRATORY_TRACT | 11 refills | Status: DC | PRN

## 2020-02-10 MED ORDER — PREDNISONE 20 MG PO TABS: 40.0000 mg | ORAL_TABLET | Freq: Every day | ORAL | 0 refills | Status: DC

## 2020-02-10 NOTE — Progress Notes (Signed)
Onset of cough a little of a week ago. It is dry cough. Recently started to be productive with some white phlegm. Patient has recently moved into an attic apartment over a garage. Has been taking delsym OTC.   Patient has a history of allergies.
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
## 2020-05-13 ENCOUNTER — Telehealth: Payer: BC Managed Care – PPO | Admitting: Internal Medicine

## 2020-08-09 ENCOUNTER — Telehealth: Payer: BC Managed Care – PPO | Admitting: Family

## 2020-11-14 DIAGNOSIS — D127 Benign neoplasm of rectosigmoid junction: Secondary | ICD-10-CM | POA: Insufficient documentation

## 2021-12-04 ENCOUNTER — Emergency Department (HOSPITAL_COMMUNITY): Payer: Medicaid Other

## 2021-12-04 ENCOUNTER — Other Ambulatory Visit: Payer: Self-pay

## 2021-12-04 ENCOUNTER — Encounter (HOSPITAL_COMMUNITY): Payer: Self-pay

## 2021-12-04 ENCOUNTER — Emergency Department (HOSPITAL_COMMUNITY)
Admission: EM | Admit: 2021-12-04 | Discharge: 2021-12-04 | Payer: Medicaid Other | Attending: Emergency Medicine | Admitting: Emergency Medicine

## 2021-12-04 DIAGNOSIS — Z23 Encounter for immunization: Secondary | ICD-10-CM | POA: Insufficient documentation

## 2021-12-04 DIAGNOSIS — F419 Anxiety disorder, unspecified: Secondary | ICD-10-CM | POA: Diagnosis not present

## 2021-12-04 DIAGNOSIS — Z20822 Contact with and (suspected) exposure to covid-19: Secondary | ICD-10-CM | POA: Insufficient documentation

## 2021-12-04 DIAGNOSIS — I10 Essential (primary) hypertension: Secondary | ICD-10-CM | POA: Insufficient documentation

## 2021-12-04 DIAGNOSIS — S02600B Fracture of unspecified part of body of mandible, initial encounter for open fracture: Secondary | ICD-10-CM | POA: Diagnosis not present

## 2021-12-04 DIAGNOSIS — W3400XA Accidental discharge from unspecified firearms or gun, initial encounter: Secondary | ICD-10-CM

## 2021-12-04 DIAGNOSIS — S0993XA Unspecified injury of face, initial encounter: Secondary | ICD-10-CM | POA: Diagnosis present

## 2021-12-04 DIAGNOSIS — R000 Tachycardia, unspecified: Secondary | ICD-10-CM | POA: Diagnosis not present

## 2021-12-04 HISTORY — DX: Accidental discharge from unspecified firearms or gun, initial encounter: W34.00XA

## 2021-12-04 LAB — COMPREHENSIVE METABOLIC PANEL
ALT: 18 U/L (ref 0–44)
AST: 34 U/L (ref 15–41)
Albumin: 3.9 g/dL (ref 3.5–5.0)
Alkaline Phosphatase: 51 U/L (ref 38–126)
Anion gap: 13 (ref 5–15)
BUN: 11 mg/dL (ref 6–20)
CO2: 22 mmol/L (ref 22–32)
Calcium: 9.1 mg/dL (ref 8.9–10.3)
Chloride: 101 mmol/L (ref 98–111)
Creatinine, Ser: 0.77 mg/dL (ref 0.44–1.00)
GFR, Estimated: >60 mL/min (ref >60)
Glucose, Bld: 162 mg/dL — ABNORMAL HIGH (ref 70–99)
Potassium: 3.2 mmol/L — ABNORMAL LOW (ref 3.5–5.1)
Sodium: 136 mmol/L (ref 135–145)
Total Bilirubin: 0.3 mg/dL (ref 0.3–1.2)
Total Protein: 7.1 g/dL (ref 6.5–8.1)

## 2021-12-04 LAB — CBC
HCT: 38.2 % (ref 36.0–46.0)
Hemoglobin: 12.6 g/dL (ref 12.0–15.0)
MCH: 33.2 pg (ref 26.0–34.0)
MCHC: 33 g/dL (ref 30.0–36.0)
MCV: 100.8 fL — ABNORMAL HIGH (ref 80.0–100.0)
Platelets: 317 10*3/uL (ref 150–400)
RBC: 3.79 MIL/uL — ABNORMAL LOW (ref 3.87–5.11)
RDW: 12 % (ref 11.5–15.5)
WBC: 13.7 10*3/uL — ABNORMAL HIGH (ref 4.0–10.5)
nRBC: 0 % (ref 0.0–0.2)

## 2021-12-04 LAB — I-STAT CHEM 8, ED
BUN: 14 mg/dL (ref 6–20)
Calcium, Ion: 1.11 mmol/L — ABNORMAL LOW (ref 1.15–1.40)
Chloride: 101 mmol/L (ref 98–111)
Creatinine, Ser: 1 mg/dL (ref 0.44–1.00)
Glucose, Bld: 160 mg/dL — ABNORMAL HIGH (ref 70–99)
HCT: 39 % (ref 36.0–46.0)
Hemoglobin: 13.3 g/dL (ref 12.0–15.0)
Potassium: 3.7 mmol/L (ref 3.5–5.1)
Sodium: 138 mmol/L (ref 135–145)
TCO2: 25 mmol/L (ref 22–32)

## 2021-12-04 LAB — LACTIC ACID, PLASMA: Lactic Acid, Venous: 4.4 mmol/L (ref 0.5–1.9)

## 2021-12-04 LAB — RESP PANEL BY RT-PCR (FLU A&B, COVID) ARPGX2
Influenza A by PCR: NEGATIVE
Influenza B by PCR: NEGATIVE
SARS Coronavirus 2 by RT PCR: NEGATIVE

## 2021-12-04 LAB — SAMPLE TO BLOOD BANK

## 2021-12-04 LAB — ETHANOL: Alcohol, Ethyl (B): 306 mg/dL (ref <10)

## 2021-12-04 LAB — PROTIME-INR
INR: 0.9 (ref 0.8–1.2)
Prothrombin Time: 12 seconds (ref 11.4–15.2)

## 2021-12-04 MED ORDER — FENTANYL CITRATE PF 50 MCG/ML IJ SOSY
100.0000 ug | PREFILLED_SYRINGE | Freq: Once | INTRAMUSCULAR | Status: AC
  Administered 2021-12-04: 100 ug via INTRAVENOUS

## 2021-12-04 MED ORDER — FENTANYL 2500MCG IN NS 250ML (10MCG/ML) PREMIX INFUSION
25.0000 ug/h | INTRAVENOUS | Status: DC
  Administered 2021-12-04: 50 ug/h via INTRAVENOUS
  Filled 2021-12-04: qty 250

## 2021-12-04 MED ORDER — EPINEPHRINE 1 MG/10ML IJ SOSY
PREFILLED_SYRINGE | INTRAMUSCULAR | Status: AC | PRN
  Administered 2021-12-04: 1 mg via INTRAVENOUS

## 2021-12-04 MED ORDER — PIPERACILLIN-TAZOBACTAM 3.375 G IVPB 30 MIN
3.3750 g | Freq: Once | INTRAVENOUS | Status: AC
  Administered 2021-12-04: 3.375 g via INTRAVENOUS

## 2021-12-04 MED ORDER — PROPOFOL 1000 MG/100ML IV EMUL: 5.0000 ug/kg/min | INTRAVENOUS | Status: DC

## 2021-12-04 MED ORDER — ONDANSETRON HCL 4 MG/2ML IJ SOLN
INTRAMUSCULAR | Status: AC
  Filled 2021-12-04: qty 2

## 2021-12-04 MED ORDER — CEFAZOLIN SODIUM-DEXTROSE 2-4 GM/100ML-% IV SOLN
2.0000 g | Freq: Once | INTRAVENOUS | Status: DC
Start: 2021-12-04 — End: 2021-12-04

## 2021-12-04 MED ORDER — FENTANYL CITRATE PF 50 MCG/ML IJ SOSY
PREFILLED_SYRINGE | INTRAMUSCULAR | Status: AC | PRN
  Administered 2021-12-04: 100 ug via INTRAVENOUS
  Administered 2021-12-04: 50 ug via INTRAVENOUS

## 2021-12-04 MED ORDER — FENTANYL CITRATE PF 50 MCG/ML IJ SOSY: 50.0000 ug | PREFILLED_SYRINGE | Freq: Once | INTRAMUSCULAR | Status: DC | PRN

## 2021-12-04 MED ORDER — FENTANYL CITRATE (PF) 100 MCG/2ML IJ SOLN
INTRAMUSCULAR | Status: AC
  Filled 2021-12-04: qty 2

## 2021-12-04 MED ORDER — HYDROMORPHONE HCL 1 MG/ML IJ SOLN
INTRAMUSCULAR | Status: AC | PRN
  Administered 2021-12-04: 1 mg via INTRAMUSCULAR

## 2021-12-04 MED ORDER — ONDANSETRON HCL 4 MG/2ML IJ SOLN
INTRAMUSCULAR | Status: AC | PRN
  Administered 2021-12-04: 4 mg via INTRAVENOUS

## 2021-12-04 MED ORDER — TETANUS-DIPHTH-ACELL PERTUSSIS 5-2.5-18.5 LF-MCG/0.5 IM SUSY
0.5000 mL | PREFILLED_SYRINGE | Freq: Once | INTRAMUSCULAR | Status: AC
  Administered 2021-12-04: 0.5 mL via INTRAMUSCULAR

## 2021-12-04 MED ORDER — PROPOFOL 1000 MG/100ML IV EMUL
INTRAVENOUS | Status: AC
  Administered 2021-12-04: 20 ug/kg/min via INTRAVENOUS
  Filled 2021-12-04: qty 100

## 2021-12-04 MED ORDER — SODIUM CHLORIDE 0.9 % IV SOLN
INTRAVENOUS | Status: AC | PRN
Start: 2021-12-04 — End: 2021-12-04
  Administered 2021-12-04: 1000 mL via INTRAVENOUS

## 2021-12-04 MED ORDER — HYDROMORPHONE HCL 1 MG/ML IJ SOLN
INTRAMUSCULAR | Status: AC
  Filled 2021-12-04: qty 1

## 2021-12-04 MED ORDER — HYDROMORPHONE HCL 1 MG/ML IJ SOLN
1.0000 mg | Freq: Once | INTRAMUSCULAR | Status: AC
  Administered 2021-12-04: 1 mg via INTRAVENOUS
  Filled 2021-12-04: qty 1

## 2021-12-04 MED ORDER — FENTANYL BOLUS VIA INFUSION
50.0000 ug | INTRAVENOUS | Status: DC | PRN
  Filled 2021-12-04: qty 50

## 2021-12-04 NOTE — ED Notes (Signed)
Transfer line called, patient auto-accepted by Louanna Raw EDP.  ?
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
## 2021-12-04 NOTE — Progress Notes (Signed)
Patient coughed and respiratory coded as EMS began positioning her to transport her to [facility].  Patient received chest compressions and one dose of epinephrine.  I placed endotracheal tube using glidescope.  Pulse check after intubation with strong palpable pulses. CXR with appropriately positioned endotracheal tube. Patient taken to [facility] by EMS. ? ?Felicie Morn, MD ?Trauma Surgery ?[facility], [place] ? ?

## 2021-12-04 NOTE — ED Notes (Signed)
IV started by EMS has dislodged, Emilie attempting IV at this time ?
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
## 2021-12-04 NOTE — Progress Notes (Signed)
Orthopedic Tech Progress Note ?Patient Details:  ?Nicole Mccarty ?[birth year] ?[id number] ? ?Patient ID: Doristine Counter, female   DOB: [birth year], 56 y.o.   MRN: [id number] ?I attended trauma page. ?Karolee Stamps ?12/04/2021, 9:47 PM ? ?
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
## 2021-12-04 NOTE — ED Notes (Signed)
[facility] Transfer line for consult for trauma doctor, [phone] ?
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
## 2021-12-04 NOTE — ED Notes (Signed)
Pt unable to lay down flat - MD states to deter Ct scans at this time. Pt to be transported to [facility] via [place] ?

## 2021-12-04 NOTE — ED Provider Notes (Signed)
.  Critical Care ?Performed by: Lennice Sites, DO ?Authorized by: Lennice Sites, DO  ? ?Critical care provider statement:  ?  Critical care time (minutes):  55 ?  Critical care was necessary to treat or prevent imminent or life-threatening deterioration of the following conditions:  Trauma ?  Critical care was time spent personally by me on the following activities:  Blood draw for specimens, development of treatment plan with patient or surrogate, discussions with consultants, discussions with primary provider, examination of patient, evaluation of patient's response to treatment, obtaining history from patient or surrogate, ordering and performing treatments and interventions, ordering and review of laboratory studies, ordering and review of radiographic studies, pulse oximetry, re-evaluation of patient's condition and review of old charts ?  Care discussed with: admitting provider   ? ?  ?Lennice Sites, DO ?12/04/21 2212 ? ?

## 2021-12-04 NOTE — ED Notes (Signed)
[facility] began to bandage the jaw when the patient began to what appeared like choking. She coughed up a large blood clot and then went unresponsive with no pulse and was not breathing. CPR immediately started at 2140 by Emilie L, RN and Code blue called. EDP and resp arrived to bedside immediately. O2 sats at this time were 28%. Pt intubated,1 mg of Epi given and pulse regained at 2145. IO drilled to left tibia. See MAR for medication administration.  ? ?Per Dr.EDP no OG tube to be inserted. ?

## 2021-12-05 ENCOUNTER — Encounter: Payer: Self-pay | Admitting: Internal Medicine

## 2021-12-05 LAB — BPAM RBC
Blood Product Expiration Date: 202305042359
Blood Product Expiration Date: 202305042359
ISSUE DATE / TIME: 202304092142
ISSUE DATE / TIME: 202304092142
Unit Type and Rh: 5100
Unit Type and Rh: 5100

## 2021-12-05 LAB — TYPE AND SCREEN
ABO/RH(D): A POS
Antibody Screen: NEGATIVE
Unit division: 0
Unit division: 0

## 2021-12-05 LAB — PREPARE FRESH FROZEN PLASMA
Unit division: 0
Unit division: 0

## 2021-12-05 LAB — BPAM FFP
Blood Product Expiration Date: 202304262359
Blood Product Expiration Date: 202304262359
ISSUE DATE / TIME: 202304092143
ISSUE DATE / TIME: 202304092143
Unit Type and Rh: 6200
Unit Type and Rh: 6200

## 2022-01-05 DIAGNOSIS — F332 Major depressive disorder, recurrent severe without psychotic features: Secondary | ICD-10-CM | POA: Insufficient documentation

## 2022-01-05 DIAGNOSIS — F1020 Alcohol dependence, uncomplicated: Secondary | ICD-10-CM | POA: Insufficient documentation

## 2022-01-05 DIAGNOSIS — F411 Generalized anxiety disorder: Secondary | ICD-10-CM | POA: Insufficient documentation

## 2022-01-11 DIAGNOSIS — S0266XB Fracture of symphysis of mandible, initial encounter for open fracture: Secondary | ICD-10-CM | POA: Insufficient documentation

## 2022-07-04 DIAGNOSIS — S0183XS Puncture wound without foreign body of other part of head, sequela: Secondary | ICD-10-CM | POA: Insufficient documentation

## 2022-07-22 ENCOUNTER — Other Ambulatory Visit: Payer: Self-pay

## 2022-07-22 ENCOUNTER — Emergency Department (HOSPITAL_COMMUNITY): Payer: Medicaid Other

## 2022-07-22 ENCOUNTER — Emergency Department (HOSPITAL_COMMUNITY)
Admission: EM | Admit: 2022-07-22 | Discharge: 2022-07-22 | Disposition: A | Discharge status: Other Acute Inpatient Hospital | Payer: Medicaid Other | Attending: Emergency Medicine | Admitting: Emergency Medicine

## 2022-07-22 DIAGNOSIS — T8149XA Infection following a procedure, other surgical site, initial encounter: Secondary | ICD-10-CM | POA: Insufficient documentation

## 2022-07-22 DIAGNOSIS — J4521 Mild intermittent asthma with (acute) exacerbation: Secondary | ICD-10-CM | POA: Insufficient documentation

## 2022-07-22 DIAGNOSIS — I10 Essential (primary) hypertension: Secondary | ICD-10-CM | POA: Diagnosis not present

## 2022-07-22 DIAGNOSIS — E039 Hypothyroidism, unspecified: Secondary | ICD-10-CM | POA: Insufficient documentation

## 2022-07-22 DIAGNOSIS — Z87891 Personal history of nicotine dependence: Secondary | ICD-10-CM | POA: Diagnosis not present

## 2022-07-22 DIAGNOSIS — M79661 Pain in right lower leg: Secondary | ICD-10-CM | POA: Diagnosis present

## 2022-07-22 DIAGNOSIS — Z7951 Long term (current) use of inhaled steroids: Secondary | ICD-10-CM | POA: Diagnosis not present

## 2022-07-22 DIAGNOSIS — Z79899 Other long term (current) drug therapy: Secondary | ICD-10-CM | POA: Insufficient documentation

## 2022-07-22 LAB — CBC WITH DIFFERENTIAL/PLATELET
Abs Immature Granulocytes: 0.02 10*3/uL (ref 0.00–0.07)
Basophils Absolute: 0.1 10*3/uL (ref 0.0–0.1)
Basophils Relative: 1 %
Eosinophils Absolute: 0.1 10*3/uL (ref 0.0–0.5)
Eosinophils Relative: 2 %
HCT: 34 % — ABNORMAL LOW (ref 36.0–46.0)
Hemoglobin: 10.8 g/dL — ABNORMAL LOW (ref 12.0–15.0)
Immature Granulocytes: 0 %
Lymphocytes Relative: 38 %
Lymphs Abs: 2.8 10*3/uL (ref 0.7–4.0)
MCH: 29.4 pg (ref 26.0–34.0)
MCHC: 31.8 g/dL (ref 30.0–36.0)
MCV: 92.6 fL (ref 80.0–100.0)
Monocytes Absolute: 0.6 10*3/uL (ref 0.1–1.0)
Monocytes Relative: 8 %
Neutro Abs: 3.8 10*3/uL (ref 1.7–7.7)
Neutrophils Relative %: 51 %
Platelets: 616 10*3/uL — ABNORMAL HIGH (ref 150–400)
RBC: 3.67 MIL/uL — ABNORMAL LOW (ref 3.87–5.11)
RDW: 13.7 % (ref 11.5–15.5)
WBC: 7.5 10*3/uL (ref 4.0–10.5)
nRBC: 0 % (ref 0.0–0.2)

## 2022-07-22 LAB — COMPREHENSIVE METABOLIC PANEL
ALT: 11 U/L (ref 0–44)
AST: 15 U/L (ref 15–41)
Albumin: 4.2 g/dL (ref 3.5–5.0)
Alkaline Phosphatase: 107 U/L (ref 38–126)
Anion gap: 7 (ref 5–15)
BUN: 28 mg/dL — ABNORMAL HIGH (ref 6–20)
CO2: 25 mmol/L (ref 22–32)
Calcium: 9.6 mg/dL (ref 8.9–10.3)
Chloride: 106 mmol/L (ref 98–111)
Creatinine, Ser: 1.1 mg/dL — ABNORMAL HIGH (ref 0.44–1.00)
GFR, Estimated: 59 mL/min — ABNORMAL LOW (ref >60)
Glucose, Bld: 113 mg/dL — ABNORMAL HIGH (ref 70–99)
Potassium: 4.3 mmol/L (ref 3.5–5.1)
Sodium: 138 mmol/L (ref 135–145)
Total Bilirubin: 0.4 mg/dL (ref 0.3–1.2)
Total Protein: 7.4 g/dL (ref 6.5–8.1)

## 2022-07-22 MED ORDER — OXYCODONE-ACETAMINOPHEN 5-325 MG PO TABS
1.0000 | ORAL_TABLET | Freq: Once | ORAL | Status: AC
  Administered 2022-07-22: 1 via ORAL
  Filled 2022-07-22: qty 1

## 2022-07-22 MED ORDER — SODIUM CHLORIDE 0.9 % IV SOLN
1.0000 g | Freq: Once | INTRAVENOUS | Status: AC
  Administered 2022-07-22: 1 g via INTRAVENOUS
  Filled 2022-07-22: qty 10

## 2022-07-22 MED ORDER — VANCOMYCIN HCL IN DEXTROSE 1-5 GM/200ML-% IV SOLN
1000.0000 mg | Freq: Once | INTRAVENOUS | Status: AC
  Administered 2022-07-22: 1000 mg via INTRAVENOUS
  Filled 2022-07-22: qty 200

## 2022-07-22 MED ORDER — SODIUM CHLORIDE 0.9 % IV BOLUS
1000.0000 mL | Freq: Once | INTRAVENOUS | Status: AC
  Administered 2022-07-22: 1000 mL via INTRAVENOUS

## 2022-07-22 NOTE — Progress Notes (Signed)
A consult was received from an ED physician for Vancomycin per pharmacy dosing.  The patient's profile has been reviewed for ht/wt/allergies/indication/available labs.   A one time order has been placed for Vancomycin 1g.    Further antibiotics/pharmacy consults should be ordered by admitting physician if indicated.                       Thank you,  Gretta Arab PharmD, BCPS [facility] main pharmacy [phone] 07/22/2022 7:50 PM

## 2022-07-22 NOTE — ED Notes (Signed)
Called [facility] and spoke with [facility] to request on call ENT
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
## 2022-07-22 NOTE — ED Notes (Signed)
Patient accepted for transfer to [facility] by Dr. Sherren Mocha.

## 2022-07-22 NOTE — ED Notes (Signed)
Nicole Mccarty from [place] called to coordinate transport. [phone] for report to ED.
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
## 2022-07-22 NOTE — ED Notes (Signed)
Transferred Dr. Sherren Mocha at [facility] with Dr. Truett Mainland

## 2022-07-23 DIAGNOSIS — T8149XA Infection following a procedure, other surgical site, initial encounter: Secondary | ICD-10-CM | POA: Insufficient documentation

## 2022-10-10 ENCOUNTER — Other Ambulatory Visit: Payer: Self-pay

## 2022-10-10 ENCOUNTER — Encounter: Payer: Self-pay | Admitting: Emergency Medicine

## 2022-10-10 ENCOUNTER — Emergency Department
Admission: EM | Admit: 2022-10-10 | Discharge: 2022-10-12 | Disposition: A | Discharge status: Home / Self Care | Payer: Medicaid Other | Attending: Emergency Medicine | Admitting: Emergency Medicine

## 2022-10-10 DIAGNOSIS — F419 Anxiety disorder, unspecified: Secondary | ICD-10-CM | POA: Insufficient documentation

## 2022-10-10 DIAGNOSIS — Z1152 Encounter for screening for COVID-19: Secondary | ICD-10-CM | POA: Diagnosis not present

## 2022-10-10 DIAGNOSIS — F32A Depression, unspecified: Secondary | ICD-10-CM

## 2022-10-10 DIAGNOSIS — Z760 Encounter for issue of repeat prescription: Secondary | ICD-10-CM | POA: Insufficient documentation

## 2022-10-10 DIAGNOSIS — Z79899 Other long term (current) drug therapy: Secondary | ICD-10-CM

## 2022-10-10 LAB — ACETAMINOPHEN LEVEL: Acetaminophen (Tylenol), Serum: <10 ug/mL — ABNORMAL LOW (ref 10–30)

## 2022-10-10 LAB — COMPREHENSIVE METABOLIC PANEL
ALT: 22 U/L (ref 0–44)
AST: 26 U/L (ref 15–41)
Albumin: 4.5 g/dL (ref 3.5–5.0)
Alkaline Phosphatase: 87 U/L (ref 38–126)
Anion gap: 13 (ref 5–15)
BUN: 25 mg/dL — ABNORMAL HIGH (ref 6–20)
CO2: 21 mmol/L — ABNORMAL LOW (ref 22–32)
Calcium: 9.7 mg/dL (ref 8.9–10.3)
Chloride: 106 mmol/L (ref 98–111)
Creatinine, Ser: 1.03 mg/dL — ABNORMAL HIGH (ref 0.44–1.00)
GFR, Estimated: >60 mL/min (ref >60)
Glucose, Bld: 172 mg/dL — ABNORMAL HIGH (ref 70–99)
Potassium: 3.6 mmol/L (ref 3.5–5.1)
Sodium: 140 mmol/L (ref 135–145)
Total Bilirubin: 1.2 mg/dL (ref 0.3–1.2)
Total Protein: 7.5 g/dL (ref 6.5–8.1)

## 2022-10-10 LAB — CBC
HCT: 36.2 % (ref 36.0–46.0)
Hemoglobin: 11.5 g/dL — ABNORMAL LOW (ref 12.0–15.0)
MCH: 28.1 pg (ref 26.0–34.0)
MCHC: 31.8 g/dL (ref 30.0–36.0)
MCV: 88.5 fL (ref 80.0–100.0)
Platelets: 373 10*3/uL (ref 150–400)
RBC: 4.09 MIL/uL (ref 3.87–5.11)
RDW: 13.1 % (ref 11.5–15.5)
WBC: 8.5 10*3/uL (ref 4.0–10.5)
nRBC: 0 % (ref 0.0–0.2)

## 2022-10-10 LAB — URINE DRUG SCREEN, QUALITATIVE (ARMC ONLY)
Amphetamines, Ur Screen: NOT DETECTED
Barbiturates, Ur Screen: NOT DETECTED
Benzodiazepine, Ur Scrn: NOT DETECTED
Cannabinoid 50 Ng, Ur ~~LOC~~: NOT DETECTED
Cocaine Metabolite,Ur ~~LOC~~: NOT DETECTED
MDMA (Ecstasy)Ur Screen: NOT DETECTED
Methadone Scn, Ur: NOT DETECTED
Opiate, Ur Screen: NOT DETECTED
Phencyclidine (PCP) Ur S: NOT DETECTED
Tricyclic, Ur Screen: NOT DETECTED

## 2022-10-10 LAB — RESP PANEL BY RT-PCR (RSV, FLU A&B, COVID)  RVPGX2
Influenza A by PCR: NEGATIVE
Influenza B by PCR: NEGATIVE
Resp Syncytial Virus by PCR: NEGATIVE
SARS Coronavirus 2 by RT PCR: NEGATIVE

## 2022-10-10 LAB — ETHANOL: Alcohol, Ethyl (B): <10 mg/dL (ref <10)

## 2022-10-10 LAB — SALICYLATE LEVEL: Salicylate Lvl: <7 mg/dL — ABNORMAL LOW (ref 7.0–30.0)

## 2022-10-10 MED ORDER — OXYCODONE-ACETAMINOPHEN 5-325 MG PO TABS: 1.0000 | ORAL_TABLET | Freq: Four times a day (QID) | ORAL | Status: DC | PRN

## 2022-10-10 MED ORDER — PREGABALIN 50 MG PO CAPS: 50.0000 mg | ORAL_CAPSULE | Freq: Two times a day (BID) | ORAL | 0 refills | Status: DC

## 2022-10-10 MED ORDER — OXYCODONE HCL 5 MG PO TABS: 5.0000 mg | ORAL_TABLET | Freq: Three times a day (TID) | ORAL | 0 refills | Status: AC | PRN

## 2022-10-10 MED ORDER — METHOCARBAMOL 500 MG PO TABS
500.0000 mg | ORAL_TABLET | Freq: Two times a day (BID) | ORAL | Status: DC
  Administered 2022-10-10 – 2022-10-12 (×4): 500 mg via ORAL
  Filled 2022-10-10 (×4): qty 1

## 2022-10-10 MED ORDER — ASPIRIN 325 MG PO TBEC: 325.0000 mg | DELAYED_RELEASE_TABLET | Freq: Every day | ORAL | 3 refills | Status: AC

## 2022-10-10 MED ORDER — PREGABALIN 50 MG PO CAPS
50.0000 mg | ORAL_CAPSULE | Freq: Two times a day (BID) | ORAL | Status: DC
  Administered 2022-10-10 – 2022-10-12 (×4): 50 mg via ORAL
  Filled 2022-10-10 (×4): qty 1

## 2022-10-10 MED ORDER — HYDROXYZINE HCL 25 MG PO TABS
25.0000 mg | ORAL_TABLET | Freq: Three times a day (TID) | ORAL | Status: DC | PRN
  Administered 2022-10-11: 25 mg via ORAL
  Filled 2022-10-10: qty 1

## 2022-10-10 MED ORDER — ONDANSETRON 4 MG PO TBDP: 4.0000 mg | ORAL_TABLET | Freq: Three times a day (TID) | ORAL | 0 refills | Status: AC | PRN

## 2022-10-10 MED ORDER — OXYCODONE HCL 5 MG PO TABS
5.0000 mg | ORAL_TABLET | Freq: Four times a day (QID) | ORAL | Status: DC | PRN
  Administered 2022-10-10 – 2022-10-12 (×4): 5 mg via ORAL
  Filled 2022-10-10 (×5): qty 1

## 2022-10-10 MED ORDER — MELATONIN 5 MG PO TABS: 5.0000 mg | ORAL_TABLET | Freq: Every evening | ORAL | Status: DC | PRN

## 2022-10-10 MED ORDER — BUSPIRONE HCL 5 MG PO TABS: 5.0000 mg | ORAL_TABLET | Freq: Three times a day (TID) | ORAL | 0 refills | Status: AC

## 2022-10-10 MED ORDER — BUSPIRONE HCL 10 MG PO TABS
5.0000 mg | ORAL_TABLET | Freq: Three times a day (TID) | ORAL | Status: DC
  Administered 2022-10-10 – 2022-10-12 (×7): 5 mg via ORAL
  Filled 2022-10-10 (×7): qty 1

## 2022-10-10 MED ORDER — FLUOXETINE HCL 20 MG PO CAPS: 20.0000 mg | ORAL_CAPSULE | Freq: Every day | ORAL | 2 refills | Status: DC

## 2022-10-10 MED ORDER — MAGNESIUM OXIDE -MG SUPPLEMENT 400 (240 MG) MG PO TABS
400.0000 mg | ORAL_TABLET | Freq: Two times a day (BID) | ORAL | Status: DC
  Administered 2022-10-10 – 2022-10-12 (×4): 400 mg via ORAL
  Filled 2022-10-10 (×7): qty 1

## 2022-10-10 MED ORDER — HYDROXYZINE HCL 25 MG PO TABS: 25.0000 mg | ORAL_TABLET | Freq: Three times a day (TID) | ORAL | 0 refills | Status: DC | PRN

## 2022-10-10 MED ORDER — TRAZODONE HCL 100 MG PO TABS
100.0000 mg | ORAL_TABLET | Freq: Every day | ORAL | Status: DC
  Administered 2022-10-10 – 2022-10-11 (×2): 100 mg via ORAL
  Filled 2022-10-10 (×2): qty 1

## 2022-10-10 MED ORDER — FLUOXETINE HCL 20 MG/5ML PO SOLN
80.0000 mg | Freq: Every day | ORAL | Status: DC
  Administered 2022-10-10 – 2022-10-12 (×3): 80 mg via ORAL
  Filled 2022-10-10 (×3): qty 20

## 2022-10-10 MED ORDER — ACETAMINOPHEN 325 MG PO TABS
325.0000 mg | ORAL_TABLET | Freq: Four times a day (QID) | ORAL | Status: DC | PRN
  Administered 2022-10-10 – 2022-10-12 (×3): 325 mg via ORAL
  Filled 2022-10-10 (×3): qty 1
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
## 2022-10-10 NOTE — ED Notes (Signed)
VOL/  PENDING  CONSULT 
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
## 2022-10-10 NOTE — ED Notes (Signed)
Pt refused pm snack

## 2022-10-10 NOTE — ED Triage Notes (Signed)
Pt sts that she was at [facility]. Pt sts that she was d/c from the facility after pt was away from the facility greater than 24hrs. Pt did not receive her medications when she told to leave.

## 2022-10-10 NOTE — ED Notes (Signed)
[name] with pt.

## 2022-10-10 NOTE — ED Notes (Signed)
Pt belongings: Youth worker pants Black bra Blue underwear Pink and grey socks Boot 1 slipper 1 scrunchie Cell phone [place]

## 2022-10-10 NOTE — ED Provider Notes (Addendum)
[facility] Provider Note    Event Date/Time   First MD Initiated Contact with Patient 10/10/22 1213     (approximate)   History   Medication Refill   HPI  Nicole Mccarty is a 57 y.o. female who presents for medication refill.  Patient reports she left a rehab facility early towards [place] with her daughter, did not have prescriptions provided.  She reports she needs all of her prescriptions refilled, she is asymptomatic, has no physical complaints   Physical Exam   Triage Vital Signs: ED Triage Vitals  Enc Vitals Group     BP 10/10/22 1210 (!) 119/95     Pulse Rate 10/10/22 1210 (!) 104     Resp 10/10/22 1210 19     Temp 10/10/22 1210 97.6 F (36.4 C)     Temp Source 10/10/22 1210 Axillary     SpO2 10/10/22 1210 97 %     Weight 10/10/22 1211 60.3 kg (133 lb)     Height 10/10/22 1218 1.626 m (5' 4"$ )     Head Circumference --      Peak Flow --      Pain Score 10/10/22 1210 8     Pain Loc --      Pain Edu? --      Excl. in [place]? --     Most recent vital signs: Vitals:   10/10/22 1210  BP: (!) 119/95  Pulse: (!) 104  Resp: 19  Temp: 97.6 F (36.4 C)  SpO2: 97%     General: Awake, no distress.  CV:  Good peripheral perfusion.  Resp:  Normal effort.  Abd:  No distention.  Other:     ED Results / Procedures / Treatments   Labs (all labs ordered are listed, but only abnormal results are displayed) Labs Reviewed - No data to display   EKG    RADIOLOGY     PROCEDURES:  Critical Care performed:   Procedures   MEDICATIONS ORDERED IN ED: Medications - No data to display   IMPRESSION / MDM / [place] / ED COURSE  I reviewed the triage vital signs and the nursing notes. Patient's presentation is most consistent with acute, uncomplicated illness.   Patient is here for medication refill, no other complaints.  I had pharmacy tech contact facility, they have said that they will fax MAR but that has not  happened as of yet.  ----------------------------------------- 2:21 PM on 10/10/2022 ----------------------------------------- Still not received fax, I will have the patient follow-up on with facility for prescriptions   ----------------------------------------- 2:33 PM on 10/10/2022 ----------------------------------------- Patient states that she cannot take care of herself and has nowhere to go, will consult TTS, psychiatry for evaluation and possible placement given her significant psychiatric history    The patient has been placed in psychiatric observation due to the need to provide a safe environment for the patient while obtaining psychiatric consultation and evaluation, as well as ongoing medical and medication management to treat the patient's condition.  The patient has not been placed under full IVC at this time.       FINAL CLINICAL IMPRESSION(S) / ED DIAGNOSES   Final diagnoses:  Medication management     Rx / DC Orders   ED Discharge Orders          Ordered    aspirin EC 325 MG tablet  Daily        10/10/22 1426    busPIRone (BUSPAR) 5 MG  tablet  3 times daily        10/10/22 1426    FLUoxetine (PROZAC) 20 MG capsule  Daily        10/10/22 1426    hydrOXYzine (ATARAX) 25 MG tablet  3 times daily PRN        10/10/22 1426    ondansetron (ZOFRAN-ODT) 4 MG disintegrating tablet  Every 8 hours PRN        10/10/22 1426    oxyCODONE (ROXICODONE) 5 MG immediate release tablet  Every 8 hours PRN        10/10/22 1426    pregabalin (LYRICA) 50 MG capsule  2 times daily        10/10/22 1426             Note:  This document was prepared using Dragon voice recognition software and may include unintentional dictation errors.   Lavonia Drafts, MD 10/10/22 1422    Lavonia Drafts, MD 10/10/22 1434

## 2022-10-10 NOTE — ED Notes (Signed)
Pt moved to [facility].  Reports off to andrea rn bhu nurse

## 2022-10-10 NOTE — ED Notes (Signed)
Pt moved to [facility] from ED. Pt oriented to room and department. Informed on security cameras and basic rules/policies. Verbalized understanding.
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
## 2022-10-10 NOTE — ED Notes (Signed)
Hospital meal provided, pt stated she was not hungry at this time.

## 2022-10-11 MED ORDER — ENSURE ENLIVE PO LIQD: 237.0000 mL | Freq: Two times a day (BID) | ORAL | Status: DC

## 2022-10-11 MED ORDER — IBUPROFEN 600 MG PO TABS: 600.0000 mg | ORAL_TABLET | Freq: Three times a day (TID) | ORAL | Status: DC | PRN

## 2022-10-11 MED ORDER — OXYCODONE HCL 5 MG PO TABS
5.0000 mg | ORAL_TABLET | Freq: Three times a day (TID) | ORAL | Status: DC | PRN
  Administered 2022-10-11 – 2022-10-12 (×3): 5 mg via ORAL
  Filled 2022-10-11 (×2): qty 1
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
## 2022-10-11 NOTE — ED Notes (Signed)
Patient is very pleasant, denies Si;hi or avh at this time, states she is depressed and has so much to go thru still with surgeries for reconstruction of her jaw and lower face. Patient has been interacting well with other Patients, staff will continue to monitor for safety.

## 2022-10-11 NOTE — ED Notes (Signed)
Speech therapy is here to evaluate Patient. Patient is calm and cooperative.

## 2022-10-11 NOTE — NC FL2 (Signed)
[place] LEVEL OF CARE FORM     IDENTIFICATION  Patient Name: Nicole Mccarty [facility] Birthdate: [birth year] Sex: female Admission Date (Current Location): 10/10/2022  [place] and [place] Number:  Engineering geologist and Address:  [facility], [address] [place], [place] [address]      Provider Number: [id number]  Attending Physician Name and Address:  No att. providers found  Relative Name and Phone Number:  Tavaria Sperbeck- sister- [phone]    Current Level of Care: Other (Comment) (ED boarder) Recommended Level of Care: [place] Prior Approval Number:    Date Approved/Denied:   PASRR Number: pending  Discharge Plan: SNF    Current Diagnoses: Patient Active Problem List   Diagnosis Date Noted   Depression 10/10/2022   Mild intermittent asthma with exacerbation 02/10/2020   Annual physical exam 03/12/2019   Colon polyps 08/06/2018   Anxiety 08/06/2018   Encounter for screening colonoscopy    Benign neoplasm of rectosigmoid junction    Inflammatory arthritis 07/05/2018   Essential hypertension 07/05/2018   Allergies 07/05/2018   Hypothyroidism 07/05/2018    Orientation RESPIRATION BLADDER Height & Weight     Self, Time, Situation, Place  Normal Continent Weight: 60.3 kg Height:  5' 4"$  (162.6 cm)  BEHAVIORAL SYMPTOMS/MOOD NEUROLOGICAL BOWEL NUTRITION STATUS      Continent Diet (Regular)  AMBULATORY STATUS COMMUNICATION OF NEEDS Skin   Independent Verbally Normal                       Personal Care Assistance Level of Assistance  Bathing, Feeding, Dressing Bathing Assistance: Independent Feeding assistance: Independent Dressing Assistance: Independent     Functional Limitations Info  Sight, Hearing, Speech Sight Info: Adequate Hearing Info: Adequate Speech Info: Impaired    SPECIAL CARE FACTORS FREQUENCY  PT (By licensed PT), OT (By licensed OT), Speech therapy     PT Frequency: eval at  facility OT Frequency: eval at facility     Speech Therapy Frequency: 2-3 times per week      Contractures Contractures Info: Not present    Additional Factors Info  Code Status, Allergies, Psychotropic Code Status Info: Full Allergies Info: NKA Psychotropic Info: Depression, Bipolar         Current Medications (10/11/2022):  This is the current hospital active medication list Current Facility-Administered Medications  Medication Dose Route Frequency Provider Last Rate Last Admin   oxyCODONE (Oxy IR/ROXICODONE) immediate release tablet 5 mg  5 mg Oral Q6H PRN Darrick Penna, RPH   5 mg at 10/11/22 [id number]   And   acetaminophen (TYLENOL) tablet 325 mg  325 mg Oral Q6H PRN Darrick Penna, RPH   325 mg at 10/10/22 2006   busPIRone (BUSPAR) tablet 5 mg  5 mg Oral TID Sherlon Handing, NP   5 mg at 10/10/22 2159   FLUoxetine (PROZAC) 20 MG/5ML solution 80 mg  80 mg Oral Daily Waldon Merl F, NP   80 mg at 10/10/22 1914   hydrOXYzine (ATARAX) tablet 25 mg  25 mg Oral TID PRN Sherlon Handing, NP   25 mg at 10/11/22 0754   magnesium oxide (MAG-OX) tablet 400 mg  400 mg Oral BID Duffy Bruce, MD   400 mg at 10/10/22 2159   melatonin tablet 5 mg  5 mg Oral QHS PRN Sherlon Handing, NP       methocarbamol (ROBAXIN) tablet 500 mg  500 mg Oral Q12H Duffy Bruce,  MD   500 mg at 10/10/22 2159   pregabalin (LYRICA) capsule 50 mg  50 mg Oral BID Duffy Bruce, MD   50 mg at 10/10/22 2159   traZODone (DESYREL) tablet 100 mg  100 mg Oral QHS Sherlon Handing, NP   100 mg at 10/10/22 2159   Current Outpatient Medications  Medication Sig Dispense Refill   alum & mag hydroxide-simeth (MAALOX/MYLANTA) 200-200-20 MG/5ML suspension Take 30 mLs by mouth every 4 (four) hours as needed for indigestion or heartburn.     aspirin EC 325 MG tablet Take 1 tablet (325 mg total) by mouth daily. 100 tablet 3   B Complex-C (B-COMPLEX WITH VITAMIN C) tablet Take 1 tablet by mouth daily.      benzocaine (ORAJEL) 10 % mucosal gel Use as directed 1 Application in the mouth or throat as needed for mouth pain.     busPIRone (BUSPAR) 5 MG tablet Take 1 tablet (5 mg total) by mouth 3 (three) times daily. 90 tablet 0   busPIRone (BUSPAR) 5 MG tablet Take 5 mg by mouth 3 (three) times daily.     chlorhexidine (PERIDEX) 0.12 % solution Use as directed 15 mLs in the mouth or throat 3 (three) times daily.     FLUoxetine (PROZAC) 20 MG capsule Take 1 capsule (20 mg total) by mouth daily. 30 capsule 2   FLUoxetine (PROZAC) 20 MG/5ML solution Take 20 mLs by mouth daily.     hydrocortisone cream 1 % Apply 1 Application topically 4 (four) times daily.     hydrOXYzine (ATARAX) 25 MG tablet Take 1 tablet (25 mg total) by mouth 3 (three) times daily as needed. 30 tablet 0   hydrOXYzine (VISTARIL) 25 MG capsule Take 25 mg by mouth 3 (three) times daily as needed for anxiety.     magnesium oxide (MAG-OX) 400 (240 Mg) MG tablet Take 400 mg by mouth 2 (two) times daily.     melatonin 3 MG TABS tablet Take 6 mg by mouth at bedtime as needed (Sleep).     methocarbamol (ROBAXIN) 500 MG tablet Take 500 mg by mouth every 12 (twelve) hours.     mupirocin ointment (BACTROBAN) 2 % Apply 1 Application topically 2 (two) times daily.     ondansetron (ZOFRAN) 4 MG/5ML solution Take 4 mg by mouth every 8 (eight) hours as needed for nausea or vomiting.     ondansetron (ZOFRAN-ODT) 4 MG disintegrating tablet Take 1 tablet (4 mg total) by mouth every 8 (eight) hours as needed for nausea or vomiting. 20 tablet 0   oxyCODONE (OXY IR/ROXICODONE) 5 MG immediate release tablet Take 5 mg by mouth every 4 (four) hours as needed for severe pain.     oxyCODONE (ROXICODONE) 5 MG immediate release tablet Take 1 tablet (5 mg total) by mouth every 8 (eight) hours as needed. 20 tablet 0   phenol (CHLORASEPTIC) 1.4 % LIQD Use as directed 1 spray in the mouth or throat as needed for throat irritation / pain.     pregabalin (LYRICA) 50 MG  capsule Take 1 capsule (50 mg total) by mouth 2 (two) times daily. 60 capsule 0   pregabalin (LYRICA) 50 MG capsule Take 50 mg by mouth 2 (two) times daily.     senna (SENOKOT) 8.6 MG TABS tablet Take 1 tablet by mouth at bedtime as needed for mild constipation.     traZODone (DESYREL) 50 MG tablet Take 100 mg by mouth at bedtime.     albuterol (VENTOLIN  HFA) 108 (90 Base) MCG/ACT inhaler Inhale 1-2 puffs into the lungs every 6 (six) hours as needed for wheezing or shortness of breath. (Patient not taking: Reported on 10/10/2022) 18 g 11   budesonide-formoterol (SYMBICORT) 160-4.5 MCG/ACT inhaler Inhale 2 puffs into the lungs in the morning and at bedtime. Rinse mouth (Patient not taking: Reported on 10/10/2022) 1 Inhaler 12   meloxicam (MOBIC) 7.5 MG tablet Take 1-2 tablets (7.5-15 mg total) by mouth daily. (Patient not taking: Reported on 02/10/2020) 60 tablet 5   predniSONE (DELTASONE) 20 MG tablet Take 2 tablets (40 mg total) by mouth daily with breakfast. X 7-10 days (Patient not taking: Reported on 10/10/2022) 20 tablet 0     Discharge Medications: Please see discharge summary for a list of discharge medications.  Relevant Imaging Results:  Relevant Lab Results:   Additional Information SS# [id number]  Shelbie Hutching, RN

## 2022-10-11 NOTE — ED Provider Notes (Signed)
Emergency Medicine Observation Re-evaluation Note  Nicole Mccarty is a 57 y.o. female, seen on rounds today.  Pt initially presented to the ED for complaints of Medication Refill Currently, the patient is awaiting placement.  Physical Exam  BP (!) 149/94 (BP Location: Left Arm)   Pulse 85   Temp 98.3 F (36.8 C) (Oral)   Resp 16   Ht 5' 4"$  (1.626 m)   Wt 60.3 kg   SpO2 98%   BMI 22.82 kg/m  Physical Exam General: No acute distress Cardiac: Well-perfused extremities Lungs: No respiratory distress Psych: Appropriate mood and affect  ED Course / MDM  EKG:   I have reviewed the labs performed to date as well as medications administered while in observation.  Recent changes in the last 24 hours include none.  Plan  Current plan is for placement.    Naaman Plummer, MD 10/11/22 [phone]

## 2022-10-12 NOTE — ED Notes (Signed)
Patient returned syringe for drinking fluids at this time.

## 2022-10-12 NOTE — ED Notes (Signed)
Patient washed up in bathroom with new clothes and gave clean blankets. Gave barrier cream for leg, per RN Deneise Lever.

## 2022-10-12 NOTE — ED Notes (Signed)
Dinner tray given to pt

## 2022-10-12 NOTE — TOC Transition Note (Signed)
Transition of Care [facility]) - CM/SW Discharge Note   Patient Details  Name: Johnese Barmore MRN: [id number] Date of Birth: 09-11-1965  Transition of [facility]) CM/SW Contact:  Shelbie Hutching, RN Phone Number: 10/12/2022, 3:29 PM   Clinical Narrative:    Received a call from [facility] with [place].  Anderson Malta saw that we had started a search for a bed and referral had gone out to some of there other facilities.  She reports that the facility [facility] that patient came from will take her back, that there must have been some miscommunication.  Talked with patient's sister Linus Orn, she is not sure if it would be a good idea to go back but did explain how difficult it would be to find alternative placement for patient from the emergency room.   Talked with patient at the bedside.  She would like to go back, she enjoyed 95% of the time she was there.  She would like to go back to her same room, Anderson Malta doesn't see why that can't be arranged.   Transport from one of their [place] facilities will be picking the patient up around 5:30 pm - 6pm and transporting her to [facility]    Final next level of care: Skilled Nursing Facility Barriers to Discharge: Barriers Resolved   Patient Goals and CMS Choice      Discharge Placement                Patient chooses bed at: Other - please specify in the comment section below: ([facility]) Patient to be transferred to facility by: [facility] Name of family member notified: Genesis Aylsworth- sister Patient and family notified of of transfer: 10/12/22  Discharge Plan and Services Additional resources added to the After Visit Summary for                  DME Arranged: N/A         HH Arranged: NA          Social Determinants of Health (SDOH) Interventions SDOH Screenings   Depression (PHQ2-9): Low Risk  (03/12/2019)  Tobacco Use: Medium Risk (10/10/2022)     Readmission Risk Interventions     No  data to display

## 2022-10-12 NOTE — ED Notes (Signed)
Patient was given breakfast tray with syringe to allow fluids.

## 2022-10-12 NOTE — TOC Progression Note (Signed)
Transition of Care [facility]) - Progression Note    Patient Details  Name: Astha Munsen MRN: [id number] Date of Birth: 04/06/1966  Transition of Care [facility]) CM/SW Contact  Shelbie Hutching, RN Phone Number: 10/12/2022, 3:50 PM  Clinical Narrative:     Attempted to contact sister, Linus Orn, after speaking with patient but she did not answer and has no voicemail.    Expected Discharge Plan: Skilled Nursing Facility Barriers to Discharge: Barriers Resolved  Expected Discharge Plan and Services       Living arrangements for the past 2 months: [place]                 DME Arranged: N/A         HH Arranged: NA           Social Determinants of Health (SDOH) Interventions SDOH Screenings   Depression (PHQ2-9): Low Risk  (03/12/2019)  Tobacco Use: Medium Risk (10/10/2022)    Readmission Risk Interventions     No data to display

## 2023-03-07 DIAGNOSIS — S0266XK Fracture of symphysis of mandible, subsequent encounter for fracture with nonunion: Secondary | ICD-10-CM | POA: Insufficient documentation

## 2023-03-07 DIAGNOSIS — R6884 Jaw pain: Secondary | ICD-10-CM | POA: Insufficient documentation

## 2023-03-07 DIAGNOSIS — M7918 Myalgia, other site: Secondary | ICD-10-CM | POA: Insufficient documentation

## 2023-03-21 DIAGNOSIS — H903 Sensorineural hearing loss, bilateral: Secondary | ICD-10-CM | POA: Insufficient documentation

## 2023-03-28 ENCOUNTER — Encounter: Payer: Self-pay | Admitting: Physician Assistant

## 2023-03-28 ENCOUNTER — Ambulatory Visit (INDEPENDENT_AMBULATORY_CARE_PROVIDER_SITE_OTHER): Payer: Medicaid Other | Admitting: Physician Assistant

## 2023-03-28 VITALS — BP 103/80 | HR 77 | Ht 64.0 in | Wt 140.6 lb

## 2023-03-28 DIAGNOSIS — G894 Chronic pain syndrome: Secondary | ICD-10-CM

## 2023-03-28 DIAGNOSIS — F419 Anxiety disorder, unspecified: Secondary | ICD-10-CM | POA: Diagnosis not present

## 2023-03-28 DIAGNOSIS — Z7689 Persons encountering health services in other specified circumstances: Secondary | ICD-10-CM

## 2023-03-28 DIAGNOSIS — Z1231 Encounter for screening mammogram for malignant neoplasm of breast: Secondary | ICD-10-CM

## 2023-03-28 DIAGNOSIS — Z1211 Encounter for screening for malignant neoplasm of colon: Secondary | ICD-10-CM

## 2023-03-28 DIAGNOSIS — Z780 Asymptomatic menopausal state: Secondary | ICD-10-CM

## 2023-03-28 DIAGNOSIS — F339 Major depressive disorder, recurrent, unspecified: Secondary | ICD-10-CM

## 2023-03-28 MED ORDER — FLUOXETINE HCL 20 MG PO CAPS: 20.0000 mg | ORAL_CAPSULE | Freq: Every day | ORAL | 2 refills | Status: DC
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
## 2023-03-29 DIAGNOSIS — G894 Chronic pain syndrome: Secondary | ICD-10-CM | POA: Insufficient documentation

## 2023-03-29 DIAGNOSIS — Z780 Asymptomatic menopausal state: Secondary | ICD-10-CM | POA: Insufficient documentation

## 2023-04-02 ENCOUNTER — Telehealth: Payer: Self-pay

## 2023-04-02 DIAGNOSIS — F339 Major depressive disorder, recurrent, unspecified: Secondary | ICD-10-CM

## 2023-04-02 DIAGNOSIS — F419 Anxiety disorder, unspecified: Secondary | ICD-10-CM

## 2023-04-02 NOTE — Telephone Encounter (Signed)
Copied from CRM [id number]. Topic: General - Other >> Apr 02, 2023 12:11 PM Dondra Prader E wrote: Reason for CRM: Pt called reporting that she needs 20 ML liquid instead of MG capsules. She intakes the liquid easier.  FLUoxetine (PROZAC) 20 MG capsule

## 2023-04-03 MED ORDER — FLUOXETINE HCL 20 MG/5ML PO SOLN
20.0000 mg | Freq: Every day | ORAL | 3 refills | Status: DC
Start: 2023-04-03 — End: 2023-04-14

## 2023-04-03 NOTE — Addendum Note (Signed)
Addended by: Debera Lat on: 04/03/2023 01:48 AM   Modules accepted: Orders

## 2023-04-05 ENCOUNTER — Encounter: Payer: Self-pay | Admitting: *Deleted

## 2023-04-06 DIAGNOSIS — Z0279 Encounter for issue of other medical certificate: Secondary | ICD-10-CM

## 2023-04-09 ENCOUNTER — Telehealth: Payer: Self-pay | Admitting: Physician Assistant

## 2023-04-09 ENCOUNTER — Ambulatory Visit: Payer: Self-pay

## 2023-04-09 NOTE — Telephone Encounter (Signed)
Chief Complaint: Medication question  Disposition: [] ED /[] Urgent Care (no appt availability in office) / [] Appointment(In office/virtual)/ []  [place] [facility]/ [] Home Care/ [] Refused Recommended Disposition /[] [place] [facility]/ [x]  Follow-up with PCP Additional Notes: Patient stated that she received an Rx for Fluoxetine 20MG /5ML but that is incorrect. The Rx should have been written for Fluoxetine 80MG /20ML. Patient is requesting the correct Rx be sent to [facility] in [place]. Patient also stated that she called her pain management provider for refills on oxycodone and lyrica and was told they do not write for those medications. Patient wants to know where she is suppose to get those medications from now. Advised I would forward message to provider for recommendations.  Summary: FLUoxetine (PROZAC) 20 MG/5ML solution?   The patient called in stating she would like to speak with her provider about the medication prescribed. She was prescribed FLUoxetine (PROZAC) 20 MG/5ML solution. She said it was supposed to 80 mg or 20 ml. She said that is what she has been taking. Please assist patient further     Reason for Disposition  [1] Caller has NON-URGENT medicine question about med that PCP prescribed AND [2] triager unable to answer question  Answer Assessment - Initial Assessment Questions 1. NAME of MEDICINE: "What medicine(s) are you calling about?"     Fluoxetine 20mg /63ml 2. QUESTION: "What is your question?" (e.g., double dose of medicine, side effect)     My Rx was wrong. Can the correct Rx be sent to [facility] pharmacy it should be 80mg /72mL 3. PRESCRIBER: "Who prescribed the medicine?" Reason: if prescribed by specialist, call should be referred to that group.     Eileen Stanford, PA 4. SYMPTOMS: "Do you have any symptoms?" If Yes, ask: "What symptoms are you having?"  "How bad are the symptoms (e.g., mild, moderate, severe)     No, not yet  Protocols used: Medication Question  Call-A-AH

## 2023-04-09 NOTE — Telephone Encounter (Signed)
Pt is calling in because she says she was referred to the pain management clinic to cover her care and per pt she was told the provider doesn't do prescriptions for pain medication and was told to contact her PCP. Pt says she is getting ready to have all of her teeth removed and she needs her medications for the pain. Pt says she needs the Lyrica and the Oxycodone and wanted to know was that something her PCP could refill for her. Please follow up with pt.

## 2023-04-10 ENCOUNTER — Telehealth: Payer: Self-pay | Admitting: Physician Assistant

## 2023-04-10 ENCOUNTER — Telehealth: Payer: Self-pay

## 2023-04-10 NOTE — Telephone Encounter (Signed)
Medication Refill - Medication: FLUoxetine (PROZAC) 20 MG/5ML solution    ( patient state medication should be 20 ML  and  not MG)    Has the patient contacted their pharmacy? Yes.     Preferred Pharmacy (with phone number or street name):  [facility] [address] [facility] - [place], [place] - [address] [facility] AT [address]  [address] [place] [address]  Phone: [phone] Fax: [phone]      Has the patient been seen for an appointment in the last year OR does the patient have an upcoming appointment? Yes.    Agent: Please be advised that RX refills may take up to 3 business days. We ask that you follow-up with your pharmacy.

## 2023-04-10 NOTE — Telephone Encounter (Signed)
Paperwork not completed just yet. Patient dropped forms off Friday 04/06/23 and signed patient intake form that advised patient to allow 5 business days plus date forms received to be completed.   In regards to pain medication patient is to contact dentist office as advised per [place] below. Prozac was sent to pharmacy on 03/28/23 dispensing 30 tablets with 2 refills. Dosage is at 20 mg but patient reports it was to be at 80 mg/20 mL as that is what she has been taking

## 2023-04-10 NOTE — Telephone Encounter (Signed)
Copied from CRM [id number]. Topic: Referral - Status >> Apr 10, 2023  2:48 PM Phill Myron wrote: Ms. Wikstrom said Dr Shireen Quan does not write pain med scripts... Can you refer her to another pain clinic. Please advise

## 2023-04-10 NOTE — Telephone Encounter (Signed)
Patient presented in office and wanted an update on her pain med. She also [place] wanted to know about some papers she provided on Friday to have have completed and wants to know if they are ready for pickup.

## 2023-04-11 ENCOUNTER — Encounter: Payer: Self-pay | Admitting: Physician Assistant

## 2023-04-11 ENCOUNTER — Telehealth: Payer: Self-pay | Admitting: *Deleted

## 2023-04-11 ENCOUNTER — Other Ambulatory Visit: Payer: Self-pay | Admitting: *Deleted

## 2023-04-11 ENCOUNTER — Telehealth: Payer: Self-pay

## 2023-04-11 ENCOUNTER — Telehealth: Payer: Self-pay | Admitting: Physician Assistant

## 2023-04-11 DIAGNOSIS — F419 Anxiety disorder, unspecified: Secondary | ICD-10-CM

## 2023-04-11 DIAGNOSIS — F339 Major depressive disorder, recurrent, unspecified: Secondary | ICD-10-CM

## 2023-04-11 DIAGNOSIS — Z86010 Personal history of colonic polyps: Secondary | ICD-10-CM

## 2023-04-11 MED ORDER — NA SULFATE-K SULFATE-MG SULF 17.5-3.13-1.6 GM/177ML PO SOLN: 1.0000 | Freq: Once | ORAL | 0 refills | Status: AC

## 2023-04-11 NOTE — Telephone Encounter (Signed)
Pt calling back to schedule her colonoscopy. We have left her messages and sent mychart messages for a return call.

## 2023-04-11 NOTE — Telephone Encounter (Signed)
Colonoscopy schedule on 08/07/2023 with Dr Servando Snare at [facility]

## 2023-04-11 NOTE — Telephone Encounter (Signed)
Gastroenterology Pre-Procedure Review  Request Date: 08/07/2023 Requesting Physician: Dr. Servando Snare  PATIENT REVIEW QUESTIONS: The patient responded to the following health history questions as indicated:    1. Are you having any GI issues? no 2. Do you have a personal history of Polyps? yes (07/23/2018) 3. Do you have a family history of Colon Cancer or Polyps? yes ( ) 4. Diabetes Mellitus? no 5. Joint replacements in the past 12 months?no 6. Major health problems in the past 3 months?no 7. Any artificial heart valves, MVP, or defibrillator?no    MEDICATIONS & ALLERGIES:    Patient reports the following regarding taking any anticoagulation/antiplatelet therapy:   Plavix, Coumadin, Eliquis, Xarelto, Lovenox, Pradaxa, Brilinta, or Effient? no Aspirin? yes (325 mg)  Patient confirms/reports the following medications:  Current Outpatient Medications  Medication Sig Dispense Refill   alum & mag hydroxide-simeth (MAALOX/MYLANTA) 200-200-20 MG/5ML suspension Take 30 mLs by mouth every 4 (four) hours as needed for indigestion or heartburn.     aspirin EC 325 MG tablet Take 1 tablet (325 mg total) by mouth daily. 100 tablet 3   B Complex-C (B-COMPLEX WITH VITAMIN C) tablet Take 1 tablet by mouth daily.     benzocaine (ORAJEL) 10 % mucosal gel Use as directed 1 Application in the mouth or throat as needed for mouth pain.     budesonide-formoterol (SYMBICORT) 160-4.5 MCG/ACT inhaler Inhale 2 puffs into the lungs in the morning and at bedtime. Rinse mouth 1 Inhaler 12   busPIRone (BUSPAR) 5 MG tablet Take 5 mg by mouth 3 (three) times daily.     chlorhexidine (PERIDEX) 0.12 % solution Use as directed 15 mLs in the mouth or throat 3 (three) times daily.     estradiol-norethindrone (ACTIVELLA) 1-0.5 MG tablet Take 1 tablet by mouth at bedtime.     FLUoxetine (PROZAC) 20 MG capsule Take 1 capsule (20 mg total) by mouth daily. 30 capsule 2   FLUoxetine (PROZAC) 20 MG/5ML solution Take 5 mLs (20 mg total)  by mouth daily. 120 mL 3   hydrocortisone cream 1 % Apply 1 Application topically 4 (four) times daily.     hydrOXYzine (ATARAX) 25 MG tablet Take 1 tablet (25 mg total) by mouth 3 (three) times daily as needed. 30 tablet 0   hydrOXYzine (VISTARIL) 25 MG capsule Take 25 mg by mouth 3 (three) times daily as needed for anxiety.     magnesium oxide (MAG-OX) 400 (240 Mg) MG tablet Take 400 mg by mouth 2 (two) times daily.     melatonin 3 MG TABS tablet Take 6 mg by mouth at bedtime as needed (Sleep).     meloxicam (MOBIC) 7.5 MG tablet Take 1-2 tablets (7.5-15 mg total) by mouth daily. 60 tablet 5   methocarbamol (ROBAXIN) 500 MG tablet Take 500 mg by mouth every 12 (twelve) hours.     mupirocin ointment (BACTROBAN) 2 % Apply 1 Application topically 2 (two) times daily.     ondansetron (ZOFRAN) 4 MG/5ML solution Take 4 mg by mouth every 8 (eight) hours as needed for nausea or vomiting.     ondansetron (ZOFRAN-ODT) 4 MG disintegrating tablet Take 1 tablet (4 mg total) by mouth every 8 (eight) hours as needed for nausea or vomiting. 20 tablet 0   oxyCODONE (OXY IR/ROXICODONE) 5 MG immediate release tablet Take 5 mg by mouth every 4 (four) hours as needed for severe pain.     oxyCODONE (ROXICODONE) 5 MG immediate release tablet Take 1 tablet (5 mg total) by  mouth every 8 (eight) hours as needed. 20 tablet 0   phenol (CHLORASEPTIC) 1.4 % LIQD Use as directed 1 spray in the mouth or throat as needed for throat irritation / pain.     pregabalin (LYRICA) 50 MG capsule Take 1 capsule (50 mg total) by mouth 2 (two) times daily. 60 capsule 0   pregabalin (LYRICA) 50 MG capsule Take 50 mg by mouth 2 (two) times daily.     senna (SENOKOT) 8.6 MG TABS tablet Take 1 tablet by mouth at bedtime as needed for mild constipation.     traZODone (DESYREL) 50 MG tablet Take 100 mg by mouth at bedtime.     No current facility-administered medications for this visit.    Patient confirms/reports the following allergies:   No Known Allergies  No orders of the defined types were placed in this encounter.   AUTHORIZATION INFORMATION Primary Insurance: 1D#: Group #:  Secondary Insurance: 1D#: Group #:  SCHEDULE INFORMATION: Date: 08/07/2023 Time: Location:  [facility]

## 2023-04-11 NOTE — Telephone Encounter (Signed)
The patient called in stating she left a list of medications she needs refilled when she saw her provider on 7/31. She was referred to a pain clinic but they do not do pain meds and so she is awaiting a different pain management referral. She says nothing else is working out for her. She is in a panic because nothing is working for her. She is in need of refills of all medications in her file because they were last filled by [facility] which lasted her until yesterday. She says she is feeling deseprate at this time. She uses   [facility] [address] - [place], [place] - [address] AT [facility] [address] [facility] [address] Phone: [phone]  Fax: [phone]      Please assist patient further

## 2023-04-13 ENCOUNTER — Encounter: Payer: Self-pay | Admitting: Physician Assistant

## 2023-04-14 ENCOUNTER — Encounter: Payer: Self-pay | Admitting: Physician Assistant

## 2023-04-14 MED ORDER — FLUOXETINE HCL 20 MG/5ML PO SOLN
20.0000 mg | Freq: Every day | ORAL | 3 refills | Status: DC
Start: 2023-04-14 — End: 2023-04-17

## 2023-04-16 ENCOUNTER — Ambulatory Visit (INDEPENDENT_AMBULATORY_CARE_PROVIDER_SITE_OTHER): Payer: Medicaid Other | Admitting: Certified Nurse Midwife

## 2023-04-16 ENCOUNTER — Other Ambulatory Visit (HOSPITAL_COMMUNITY)
Admission: RE | Admit: 2023-04-16 | Discharge: 2023-04-16 | Disposition: A | Discharge status: Home / Self Care | Payer: Medicaid Other | Source: Ambulatory Visit | Attending: Certified Nurse Midwife | Admitting: Certified Nurse Midwife

## 2023-04-16 ENCOUNTER — Encounter: Payer: Self-pay | Admitting: Certified Nurse Midwife

## 2023-04-16 VITALS — BP 106/85 | HR 88 | Ht 64.0 in | Wt 137.0 lb

## 2023-04-16 DIAGNOSIS — Z1151 Encounter for screening for human papillomavirus (HPV): Secondary | ICD-10-CM | POA: Diagnosis present

## 2023-04-16 DIAGNOSIS — Z01419 Encounter for gynecological examination (general) (routine) without abnormal findings: Secondary | ICD-10-CM

## 2023-04-16 DIAGNOSIS — N951 Menopausal and female climacteric states: Secondary | ICD-10-CM

## 2023-04-16 DIAGNOSIS — Z124 Encounter for screening for malignant neoplasm of cervix: Secondary | ICD-10-CM | POA: Insufficient documentation

## 2023-04-16 DIAGNOSIS — Z7689 Persons encountering health services in other specified circumstances: Secondary | ICD-10-CM

## 2023-04-16 MED ORDER — ESTRADIOL-NORETHINDRONE ACET 1-0.5 MG PO TABS: 1.0000 | ORAL_TABLET | Freq: Every day | ORAL | 4 refills | Status: DC

## 2023-04-16 MED ORDER — TRAZODONE HCL 50 MG PO TABS: 100.0000 mg | ORAL_TABLET | Freq: Every day | ORAL | 3 refills | Status: AC

## 2023-04-16 NOTE — Progress Notes (Signed)
ANNUAL EXAM Patient name: Nicole Mccarty MRN [id number]  Date of birth: 1966-05-29 Chief Complaint:   Establish Care  History of Present Illness:   Nicole Mccarty is a 57 y.o. G51P0010 Caucasian female being seen today for a routine annual exam.  Current complaints: menopausal symptoms, insomnia. She requests a refill of estrogen-per records has been on   No LMP recorded. (Menstrual status: Other).   The pregnancy intention screening data noted above was reviewed. Potential methods of contraception were discussed. The patient elected to proceed with No data recorded.   No results found for: "DIAGPAP", "HPVHIGH", "ADEQPAP"     Last pap unsure of date or result.  Last mammogram: unsure of date, last in record 2019. Results were: normal. Family h/o breast cancer: no Last colonoscopy: 2019. Results were: abnormal with polyps . Family h/o colorectal cancer: no     03/12/2019   10:30 AM  Depression screen PHQ 2/9  Decreased Interest 0  Down, Depressed, Hopeless 0  PHQ - 2 Score 0         No data to display           Review of Systems:   Pertinent items are noted in HPI Denies any headaches, blurred vision, fatigue, shortness of breath, chest pain, abdominal pain, abnormal vaginal discharge/itching/odor/irritation, problems with periods, bowel movements, urination, or intercourse unless otherwise stated above. Pertinent History Reviewed:  Reviewed past medical,surgical, social and family history.  Reviewed problem list, medications and allergies. Physical Assessment:   Vitals:   04/16/23 0903  BP: 106/85  Pulse: 88  Weight: 137 lb (62.1 kg)  Height: 5\' 4"  (1.626 m)  Body mass index is 23.52 kg/m.       Physical Exam Vitals reviewed.  Constitutional:      Appearance: Normal appearance.  HENT:     Head:     Comments: Well healed scar to right cheek.    Mouth/Throat:     Dentition: Abnormal dentition.  Neck:     Thyroid: No thyroid mass or thyromegaly.   Cardiovascular:     Rate and Rhythm: Normal rate and regular rhythm.     Heart sounds: Normal heart sounds.  Pulmonary:     Effort: Pulmonary effort is normal.     Breath sounds: Normal breath sounds.  Chest:  Breasts:    Tanner Score is 5.     Right: Normal.     Left: Normal.  Abdominal:     General: Abdomen is flat.     Palpations: Abdomen is soft.     Tenderness: There is no abdominal tenderness.  Genitourinary:    General: Normal vulva.     Vagina: Normal.     Cervix: Friability present.     Comments: Atrophic changes appropriate for age/menopause present. Pap collected. Musculoskeletal:     Cervical back: Neck supple. No tenderness.  Skin:    General: Skin is warm and dry.  Neurological:     General: No focal deficit present.     Mental Status: She is alert and oriented to person, place, and time.  Psychiatric:        Mood and Affect: Mood normal.        Behavior: Behavior normal.      No results found for this or any previous visit (from the past 24 hour(s)).  Assessment & Plan:  1. Encounter to establish care  2. Cervical cancer screening - Cytology - PAP  3. Encounter for screening for human papillomavirus (HPV) -  Cytology - PAP  4. Insomnia associated with menopause -refills provided for trazodone  5. Vasomotor symptoms due to menopause - refills provided for opposed estrogen.   Mammogram: schedule screening mammo as soon as possible, or sooner if problems Colonoscopy:  per patient is already scheduled No orders of the defined types were placed in this encounter.   Meds:  Meds ordered this encounter  Medications   traZODone (DESYREL) 50 MG tablet    Sig: Take 2 tablets (100 mg total) by mouth at bedtime.    Dispense:  180 tablet    Refill:  3    Order Specific Question:   Supervising Provider    Answer:   Hildred Laser [[id number]]   estradiol-norethindrone (ACTIVELLA) 1-0.5 MG tablet    Sig: Take 1 tablet by mouth at bedtime.    Dispense:  84  tablet    Refill:  4    Order Specific Question:   Supervising Provider    Answer:   Hildred Laser [[id number]]    Follow-up: Return in about 1 year (around 04/15/2024) for Annual exam.  Dominica Severin, CNM 04/16/2023 3:19 PM

## 2023-04-17 ENCOUNTER — Encounter: Payer: Self-pay | Admitting: Certified Nurse Midwife

## 2023-04-17 ENCOUNTER — Encounter: Payer: Self-pay | Admitting: Physician Assistant

## 2023-04-17 ENCOUNTER — Other Ambulatory Visit: Payer: Self-pay | Admitting: Family Medicine

## 2023-04-17 ENCOUNTER — Telehealth: Payer: Self-pay | Admitting: Physician Assistant

## 2023-04-17 ENCOUNTER — Other Ambulatory Visit: Payer: Self-pay | Admitting: Physician Assistant

## 2023-04-17 DIAGNOSIS — F339 Major depressive disorder, recurrent, unspecified: Secondary | ICD-10-CM

## 2023-04-17 DIAGNOSIS — F419 Anxiety disorder, unspecified: Secondary | ICD-10-CM

## 2023-04-17 LAB — CYTOLOGY - PAP
Comment: NEGATIVE
Diagnosis: NEGATIVE
High risk HPV: NEGATIVE

## 2023-04-17 MED ORDER — FLUOXETINE HCL 20 MG/5ML PO SOLN: 80.0000 mg | Freq: Every day | ORAL | 0 refills | Status: DC

## 2023-04-17 NOTE — Telephone Encounter (Addendum)
[facility] Stated they received Rx for FLUoxetine (PROZAC) 20 MG/5ML solution, pt stated directions are wrong that it should be for 80 mg, not 20 mg.   Pharmacy is seeking clarification. Please advise.
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
## 2023-04-25 ENCOUNTER — Other Ambulatory Visit: Payer: Self-pay | Admitting: Adult Medicine

## 2023-04-25 DIAGNOSIS — M8589 Other specified disorders of bone density and structure, multiple sites: Secondary | ICD-10-CM

## 2023-05-29 ENCOUNTER — Ambulatory Visit
Admission: RE | Admit: 2023-05-29 | Discharge: 2023-05-29 | Disposition: A | Discharge status: Home / Self Care | Payer: Medicaid Other | Source: Ambulatory Visit | Attending: Adult Medicine | Admitting: Adult Medicine

## 2023-05-29 ENCOUNTER — Ambulatory Visit
Admission: RE | Admit: 2023-05-29 | Discharge: 2023-05-29 | Disposition: A | Discharge status: Home / Self Care | Payer: Medicaid Other | Source: Ambulatory Visit | Attending: Physician Assistant | Admitting: Physician Assistant

## 2023-05-29 DIAGNOSIS — M8589 Other specified disorders of bone density and structure, multiple sites: Secondary | ICD-10-CM | POA: Insufficient documentation

## 2023-05-29 DIAGNOSIS — Z1231 Encounter for screening mammogram for malignant neoplasm of breast: Secondary | ICD-10-CM | POA: Insufficient documentation

## 2023-07-25 ENCOUNTER — Ambulatory Visit: Payer: Medicaid Other | Admitting: Physician Assistant

## 2023-07-31 ENCOUNTER — Telehealth: Payer: Self-pay

## 2023-07-31 NOTE — Telephone Encounter (Signed)
Called endo unit to cancel procedure for now.

## 2023-07-31 NOTE — Telephone Encounter (Signed)
Spoken to patient and for the week of 08/20/2023, there was availably in [place]. Patient decline and will reschedule next year. Will call back. I will set a reminder.

## 2023-07-31 NOTE — Telephone Encounter (Signed)
Patient called in wanting to reschedule her colonoscopy for 08/20/2023 because she don't have a ride for 08/07/2023

## 2023-08-07 ENCOUNTER — Encounter: Admission: RE | Payer: Self-pay | Source: Ambulatory Visit

## 2023-08-07 ENCOUNTER — Ambulatory Visit: Admission: RE | Admit: 2023-08-07 | Payer: Medicaid Other | Source: Ambulatory Visit | Admitting: Gastroenterology

## 2023-08-07 SURGERY — COLONOSCOPY WITH PROPOFOL
Anesthesia: General

## 2023-09-05 ENCOUNTER — Encounter: Payer: Self-pay | Admitting: *Deleted

## 2023-09-10 ENCOUNTER — Telehealth: Payer: Self-pay | Admitting: *Deleted

## 2023-09-10 ENCOUNTER — Other Ambulatory Visit: Payer: Self-pay | Admitting: *Deleted

## 2023-09-10 DIAGNOSIS — Z860100 Personal history of colon polyps, unspecified: Secondary | ICD-10-CM

## 2023-09-10 NOTE — Telephone Encounter (Signed)
 This is a reschedule from 08/07/2023  Requesting to schedule on 10/09/2023 with Dr Servando Snare at [facility].  Suprep have been resent to [facility]. New instructions will be sent.

## 2023-10-09 ENCOUNTER — Ambulatory Visit: Payer: Medicaid Other | Admitting: Anesthesiology

## 2023-10-09 ENCOUNTER — Encounter
Admission: RE | Disposition: A | Discharge status: Home / Self Care | Payer: Self-pay | Source: Home / Self Care | Attending: Gastroenterology

## 2023-10-09 ENCOUNTER — Ambulatory Visit
Admission: RE | Admit: 2023-10-09 | Discharge: 2023-10-09 | Disposition: A | Discharge status: Home / Self Care | Payer: Medicaid Other | Attending: Gastroenterology | Admitting: Gastroenterology

## 2023-10-09 ENCOUNTER — Encounter: Payer: Self-pay | Admitting: Gastroenterology

## 2023-10-09 DIAGNOSIS — I10 Essential (primary) hypertension: Secondary | ICD-10-CM | POA: Insufficient documentation

## 2023-10-09 DIAGNOSIS — Z860100 Personal history of colon polyps, unspecified: Secondary | ICD-10-CM

## 2023-10-09 DIAGNOSIS — J45909 Unspecified asthma, uncomplicated: Secondary | ICD-10-CM | POA: Diagnosis not present

## 2023-10-09 DIAGNOSIS — K640 First degree hemorrhoids: Secondary | ICD-10-CM | POA: Insufficient documentation

## 2023-10-09 DIAGNOSIS — Z87891 Personal history of nicotine dependence: Secondary | ICD-10-CM | POA: Insufficient documentation

## 2023-10-09 DIAGNOSIS — Z1211 Encounter for screening for malignant neoplasm of colon: Secondary | ICD-10-CM | POA: Diagnosis not present

## 2023-10-09 DIAGNOSIS — D120 Benign neoplasm of cecum: Secondary | ICD-10-CM | POA: Diagnosis not present

## 2023-10-09 HISTORY — PX: COLONOSCOPY WITH PROPOFOL: SHX5780

## 2023-10-09 HISTORY — PX: POLYPECTOMY: SHX5525

## 2023-10-09 SURGERY — COLONOSCOPY WITH PROPOFOL
Anesthesia: General

## 2023-10-09 MED ORDER — DEXMEDETOMIDINE HCL IN NACL 80 MCG/20ML IV SOLN
INTRAVENOUS | Status: DC | PRN
  Administered 2023-10-09: 20 ug via INTRAVENOUS

## 2023-10-09 MED ORDER — PROPOFOL 500 MG/50ML IV EMUL
INTRAVENOUS | Status: DC | PRN
  Administered 2023-10-09: 75 ug/kg/min via INTRAVENOUS

## 2023-10-09 MED ORDER — PROPOFOL 10 MG/ML IV BOLUS
INTRAVENOUS | Status: DC | PRN
  Administered 2023-10-09: 30 mg via INTRAVENOUS
  Administered 2023-10-09 (×2): 50 mg via INTRAVENOUS
  Administered 2023-10-09: 30 mg via INTRAVENOUS

## 2023-10-09 MED ORDER — LIDOCAINE HCL (PF) 2 % IJ SOLN
INTRAMUSCULAR | Status: AC
Start: 2023-10-09 — End: ?
  Filled 2023-10-09: qty 5

## 2023-10-09 MED ORDER — LIDOCAINE HCL (CARDIAC) PF 100 MG/5ML IV SOSY
PREFILLED_SYRINGE | INTRAVENOUS | Status: DC | PRN
  Administered 2023-10-09: 60 mg via INTRAVENOUS

## 2023-10-09 MED ORDER — SODIUM CHLORIDE 0.9 % IV SOLN: INTRAVENOUS | Status: DC

## 2023-10-09 NOTE — Op Note (Signed)
[facility] Gastroenterology Patient Name: Nicole Mccarty Procedure Date: 10/09/2023 8:31 AM MRN: [id number] Account #: [id number] Date of Birth: 02/19/1966 Admit Type: Outpatient Age: 58 Room: [facility] ENDO ROOM 4 Gender: Female Note Status: Finalized Instrument Name: Prentice Docker [id number] Procedure:             Colonoscopy Indications:           High risk colon cancer surveillance: Personal history                         of colonic polyps Providers:             Midge Minium MD, MD Referring MD:          No Local Md, MD (Referring MD) Medicines:             Propofol per Anesthesia Complications:         No immediate complications. Procedure:             Pre-Anesthesia Assessment:                        - Prior to the procedure, a History and Physical was                         performed, and patient medications and allergies were                         reviewed. The patient's tolerance of previous                         anesthesia was also reviewed. The risks and benefits                         of the procedure and the sedation options and risks                         were discussed with the patient. All questions were                         answered, and informed consent was obtained. Prior                         Anticoagulants: The patient has taken no anticoagulant                         or antiplatelet agents. ASA Grade Assessment: II - A                         patient with mild systemic disease. After reviewing                         the risks and benefits, the patient was deemed in                         satisfactory condition to undergo the procedure.                        After obtaining informed consent, the colonoscope was  passed under direct vision. Throughout the procedure,                         the patient's blood pressure, pulse, and oxygen                         saturations were monitored continuously. The                          Colonoscope was introduced through the anus and                         advanced to the the cecum, identified by appendiceal                         orifice and ileocecal valve. The colonoscopy was                         performed without difficulty. The patient tolerated                         the procedure well. The quality of the bowel                         preparation was excellent. Findings:      The perianal and digital rectal examinations were normal.      A 3 mm polyp was found in the cecum. The polyp was sessile. The polyp       was removed with a cold snare. Resection and retrieval were complete.      Non-bleeding internal hemorrhoids were found during retroflexion. The       hemorrhoids were Grade I (internal hemorrhoids that do not prolapse). Impression:            - One 3 mm polyp in the cecum, removed with a cold                         snare. Resected and retrieved.                        - Non-bleeding internal hemorrhoids. Recommendation:        - Discharge patient to home.                        - Resume previous diet.                        - Continue present medications.                        - Repeat colonoscopy in 7 years for surveillance. Procedure Code(s):     --- Professional ---                        [phone], Colonoscopy, flexible; with removal of                         tumor(s), polyp(s), or other lesion(s) by snare  technique Diagnosis Code(s):     --- Professional ---                        Z86.010, Personal history of colonic polyps                        D12.0, Benign neoplasm of cecum CPT copyright 2022 American Medical Association. All rights reserved. The codes documented in this report are preliminary and upon coder review may  be revised to meet current compliance requirements. Midge Minium MD, MD 10/09/2023 8:55:33 AM This report has been signed electronically. Number of Addenda: 0 Note Initiated On: 10/09/2023  8:31 AM Scope Withdrawal Time: 0 hours 6 minutes 55 seconds  Total Procedure Duration: 0 hours 15 minutes 36 seconds  Estimated Blood Loss:  Estimated blood loss: none.      [facility]

## 2023-10-09 NOTE — Transfer of Care (Signed)
Immediate Anesthesia Transfer of Care Note  Patient: Nicole Mccarty  Procedure(s) Performed: COLONOSCOPY WITH PROPOFOL POLYPECTOMY  Patient Location: PACU  Anesthesia Type:General  Level of Consciousness: sedated  Airway & Oxygen Therapy: Patient Spontanous Breathing  Post-op Assessment: Report given to RN and Post -op Vital signs reviewed and stable  Post vital signs: Reviewed and stable  Last Vitals:  Vitals Value Taken Time  BP 102/82 10/09/23 0856  Temp 35.6 C 10/09/23 0856  Pulse 67 10/09/23 0857  Resp    SpO2 96 % 10/09/23 0857  Vitals shown include unfiled device data.  Last Pain:  Vitals:   10/09/23 0806  TempSrc: Temporal  PainSc: 1          Complications: No notable events documented.
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
## 2023-10-09 NOTE — Anesthesia Postprocedure Evaluation (Signed)
Anesthesia Post Note  Patient: Nicole Mccarty  Procedure(s) Performed: COLONOSCOPY WITH PROPOFOL POLYPECTOMY  Patient location during evaluation: Endoscopy Anesthesia Type: General Level of consciousness: awake and alert Pain management: pain level controlled Vital Signs Assessment: post-procedure vital signs reviewed and stable Respiratory status: spontaneous breathing, nonlabored ventilation, respiratory function stable and patient connected to nasal cannula oxygen Cardiovascular status: blood pressure returned to baseline and stable Postop Assessment: no apparent nausea or vomiting Anesthetic complications: no   No notable events documented.   Last Vitals:  Vitals:   10/09/23 0910 10/09/23 0918  BP:  113/77  Pulse: 66 66  Resp:    Temp:    SpO2:  99%    Last Pain:  Vitals:   10/09/23 0806  TempSrc: Temporal  PainSc: 1                  Cleda Mccreedy Ivori Storr

## 2023-10-10 ENCOUNTER — Encounter: Payer: Self-pay | Admitting: Gastroenterology

## 2023-10-10 LAB — SURGICAL PATHOLOGY

## 2023-10-12 ENCOUNTER — Encounter: Payer: Self-pay | Admitting: Gastroenterology

## 2024-07-15 ENCOUNTER — Other Ambulatory Visit: Payer: Self-pay | Admitting: Adult Medicine

## 2024-07-15 DIAGNOSIS — Z1231 Encounter for screening mammogram for malignant neoplasm of breast: Secondary | ICD-10-CM

## 2024-09-08 ENCOUNTER — Inpatient Hospital Stay (HOSPITAL_COMMUNITY)
Admission: AD | Admit: 2024-09-08 | Discharge: 2024-09-14 | DRG: 885 | Disposition: A | Discharge status: Home / Self Care | Payer: MEDICAID | Source: Intra-hospital

## 2024-09-08 ENCOUNTER — Other Ambulatory Visit: Payer: Self-pay

## 2024-09-08 ENCOUNTER — Emergency Department
Admission: EM | Admit: 2024-09-08 | Discharge: 2024-09-08 | Disposition: A | Discharge status: Other Acute Inpatient Hospital | Payer: MEDICAID

## 2024-09-08 ENCOUNTER — Encounter (HOSPITAL_COMMUNITY): Payer: Self-pay | Admitting: Psychiatry

## 2024-09-08 ENCOUNTER — Emergency Department: Payer: MEDICAID

## 2024-09-08 DIAGNOSIS — Z8419 Family history of other disorders of kidney and ureter: Secondary | ICD-10-CM | POA: Diagnosis not present

## 2024-09-08 DIAGNOSIS — Z7951 Long term (current) use of inhaled steroids: Secondary | ICD-10-CM | POA: Diagnosis not present

## 2024-09-08 DIAGNOSIS — I10 Essential (primary) hypertension: Secondary | ICD-10-CM | POA: Diagnosis present

## 2024-09-08 DIAGNOSIS — Z9151 Personal history of suicidal behavior: Secondary | ICD-10-CM

## 2024-09-08 DIAGNOSIS — Z833 Family history of diabetes mellitus: Secondary | ICD-10-CM

## 2024-09-08 DIAGNOSIS — F4310 Post-traumatic stress disorder, unspecified: Secondary | ICD-10-CM | POA: Diagnosis present

## 2024-09-08 DIAGNOSIS — E039 Hypothyroidism, unspecified: Secondary | ICD-10-CM | POA: Diagnosis present

## 2024-09-08 DIAGNOSIS — Z79899 Other long term (current) drug therapy: Secondary | ICD-10-CM

## 2024-09-08 DIAGNOSIS — N179 Acute kidney failure, unspecified: Secondary | ICD-10-CM | POA: Diagnosis present

## 2024-09-08 DIAGNOSIS — Z8249 Family history of ischemic heart disease and other diseases of the circulatory system: Secondary | ICD-10-CM

## 2024-09-08 DIAGNOSIS — Z803 Family history of malignant neoplasm of breast: Secondary | ICD-10-CM

## 2024-09-08 DIAGNOSIS — Z791 Long term (current) use of non-steroidal anti-inflammatories (NSAID): Secondary | ICD-10-CM

## 2024-09-08 DIAGNOSIS — Z7982 Long term (current) use of aspirin: Secondary | ICD-10-CM | POA: Diagnosis not present

## 2024-09-08 DIAGNOSIS — F332 Major depressive disorder, recurrent severe without psychotic features: Secondary | ICD-10-CM | POA: Diagnosis present

## 2024-09-08 DIAGNOSIS — E7220 Disorder of urea cycle metabolism, unspecified: Secondary | ICD-10-CM | POA: Diagnosis present

## 2024-09-08 DIAGNOSIS — Z23 Encounter for immunization: Secondary | ICD-10-CM

## 2024-09-08 DIAGNOSIS — F411 Generalized anxiety disorder: Secondary | ICD-10-CM | POA: Diagnosis present

## 2024-09-08 DIAGNOSIS — Z87891 Personal history of nicotine dependence: Secondary | ICD-10-CM

## 2024-09-08 DIAGNOSIS — R443 Hallucinations, unspecified: Secondary | ICD-10-CM | POA: Insufficient documentation

## 2024-09-08 DIAGNOSIS — Z818 Family history of other mental and behavioral disorders: Secondary | ICD-10-CM

## 2024-09-08 DIAGNOSIS — Z822 Family history of deafness and hearing loss: Secondary | ICD-10-CM

## 2024-09-08 DIAGNOSIS — Z8261 Family history of arthritis: Secondary | ICD-10-CM

## 2024-09-08 DIAGNOSIS — F419 Anxiety disorder, unspecified: Secondary | ICD-10-CM | POA: Diagnosis present

## 2024-09-08 DIAGNOSIS — Z83438 Family history of other disorder of lipoprotein metabolism and other lipidemia: Secondary | ICD-10-CM

## 2024-09-08 DIAGNOSIS — R4182 Altered mental status, unspecified: Secondary | ICD-10-CM | POA: Diagnosis present

## 2024-09-08 LAB — COMPREHENSIVE METABOLIC PANEL WITH GFR
ALT: 8 U/L (ref 0–44)
AST: 15 U/L (ref 15–41)
Albumin: 4.2 g/dL (ref 3.5–5.0)
Alkaline Phosphatase: 64 U/L (ref 38–126)
Anion gap: 10 (ref 5–15)
BUN: 25 mg/dL — ABNORMAL HIGH (ref 6–20)
CO2: 17 mmol/L — ABNORMAL LOW (ref 22–32)
Calcium: 8.7 mg/dL — ABNORMAL LOW (ref 8.9–10.3)
Chloride: 111 mmol/L (ref 98–111)
Creatinine, Ser: 1.24 mg/dL — ABNORMAL HIGH (ref 0.44–1.00)
GFR, Estimated: 50 mL/min — ABNORMAL LOW
Glucose, Bld: 91 mg/dL (ref 70–99)
Potassium: 3.9 mmol/L (ref 3.5–5.1)
Sodium: 138 mmol/L (ref 135–145)
Total Bilirubin: <0.2 mg/dL (ref 0.0–1.2)
Total Protein: 6.8 g/dL (ref 6.5–8.1)

## 2024-09-08 LAB — URINE DRUG SCREEN
Amphetamines: NEGATIVE
Barbiturates: NEGATIVE
Benzodiazepines: POSITIVE — AB
Cocaine: NEGATIVE
Fentanyl: NEGATIVE
Methadone Scn, Ur: NEGATIVE
Opiates: NEGATIVE
Tetrahydrocannabinol: NEGATIVE

## 2024-09-08 LAB — CBC
HCT: 34.2 % — ABNORMAL LOW (ref 36.0–46.0)
Hemoglobin: 11.1 g/dL — ABNORMAL LOW (ref 12.0–15.0)
MCH: 29.9 pg (ref 26.0–34.0)
MCHC: 32.5 g/dL (ref 30.0–36.0)
MCV: 92.2 fL (ref 80.0–100.0)
Platelets: 313 K/uL (ref 150–400)
RBC: 3.71 MIL/uL — ABNORMAL LOW (ref 3.87–5.11)
RDW: 13.6 % (ref 11.5–15.5)
WBC: 8.6 K/uL (ref 4.0–10.5)
nRBC: 0.2 % (ref 0.0–0.2)

## 2024-09-08 LAB — URINALYSIS, ROUTINE W REFLEX MICROSCOPIC
Glucose, UA: NEGATIVE mg/dL
Hgb urine dipstick: NEGATIVE
Ketones, ur: NEGATIVE mg/dL
Leukocytes,Ua: NEGATIVE
Nitrite: NEGATIVE
Protein, ur: NEGATIVE mg/dL
Specific Gravity, Urine: 1.032 — ABNORMAL HIGH (ref 1.005–1.030)
pH: 6 (ref 5.0–8.0)

## 2024-09-08 LAB — TSH: TSH: 3.88 u[IU]/mL (ref 0.350–4.500)

## 2024-09-08 LAB — ETHANOL: Alcohol, Ethyl (B): <15 mg/dL

## 2024-09-08 LAB — AMMONIA: Ammonia: 46 umol/L — ABNORMAL HIGH (ref 9–35)

## 2024-09-08 MED ORDER — LACTULOSE 10 GM/15ML PO SOLN
20.0000 g | Freq: Once | ORAL | Status: AC
  Administered 2024-09-08: 20 g via ORAL
  Filled 2024-09-08: qty 30

## 2024-09-08 MED ORDER — HALOPERIDOL LACTATE 5 MG/ML IJ SOLN: 10.0000 mg | Freq: Three times a day (TID) | INTRAMUSCULAR | Status: DC | PRN

## 2024-09-08 MED ORDER — DIPHENHYDRAMINE HCL 25 MG PO CAPS: 50.0000 mg | ORAL_CAPSULE | Freq: Three times a day (TID) | ORAL | Status: DC | PRN

## 2024-09-08 MED ORDER — HALOPERIDOL LACTATE 5 MG/ML IJ SOLN: 5.0000 mg | Freq: Three times a day (TID) | INTRAMUSCULAR | Status: DC | PRN

## 2024-09-08 MED ORDER — DIPHENHYDRAMINE HCL 50 MG/ML IJ SOLN: 50.0000 mg | Freq: Three times a day (TID) | INTRAMUSCULAR | Status: DC | PRN

## 2024-09-08 MED ORDER — DIPHENHYDRAMINE HCL 25 MG PO CAPS: 25.0000 mg | ORAL_CAPSULE | Freq: Four times a day (QID) | ORAL | Status: DC | PRN

## 2024-09-08 MED ORDER — TRAZODONE HCL 50 MG PO TABS
50.0000 mg | ORAL_TABLET | Freq: Every evening | ORAL | Status: DC | PRN
  Administered 2024-09-08 – 2024-09-13 (×6): 50 mg via ORAL
  Filled 2024-09-08 (×7): qty 1

## 2024-09-08 MED ORDER — HALOPERIDOL 5 MG PO TABS: 5.0000 mg | ORAL_TABLET | Freq: Three times a day (TID) | ORAL | Status: DC | PRN

## 2024-09-08 MED ORDER — MAGNESIUM HYDROXIDE 400 MG/5ML PO SUSP: 30.0000 mL | Freq: Every day | ORAL | Status: DC | PRN

## 2024-09-08 MED ORDER — LORAZEPAM 2 MG/ML IJ SOLN: 2.0000 mg | Freq: Three times a day (TID) | INTRAMUSCULAR | Status: DC | PRN

## 2024-09-08 MED ORDER — ACETAMINOPHEN 325 MG PO TABS: 650.0000 mg | ORAL_TABLET | Freq: Four times a day (QID) | ORAL | Status: DC | PRN

## 2024-09-08 MED ORDER — HYDROXYZINE HCL 25 MG PO TABS
25.0000 mg | ORAL_TABLET | Freq: Four times a day (QID) | ORAL | Status: DC | PRN
  Administered 2024-09-08 – 2024-09-10 (×3): 25 mg via ORAL
  Filled 2024-09-08 (×3): qty 1

## 2024-09-08 MED ORDER — ALUM & MAG HYDROXIDE-SIMETH 200-200-20 MG/5ML PO SUSP: 30.0000 mL | ORAL | Status: DC | PRN

## 2024-09-08 NOTE — ED Provider Notes (Addendum)
 Ct head re-assuring Ammonia slightly elevated but no h/o cirrhosis and not on depakote to explain. Only slightly elevated so seems less likely a cause of her symptoms especially give pt does have psychiatric history. Pt denies any etoh use, depakote use and has no asterix. We can try a dose of lactulose  but it seems less likely to be cause of her presentation and not high enough to require admission. Did alert psych team pt medically clear.  EKG is normal sinus rate of 70 without any ST elevation, T wave version lead III, normal intervals   Ernest Ronal BRAVO, MD 09/08/24 1649    Ernest Ronal BRAVO, MD 09/08/24 2148
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
## 2024-09-08 NOTE — ED Notes (Signed)
 VOL/ moved from Rm-19H to BHU-3
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
## 2024-09-08 NOTE — ED Notes (Signed)
 Pt provided with dinner tray. Pt sitting up eating

## 2024-09-08 NOTE — BH Assessment (Signed)
 Patient has been accepted to [facility].  Accepting physician is Dr. Jadapalle.  Call report to [phone].  Representative was Luke GILDING.   ER Staff is aware of it:  Jerel, ER Secretary  Dr. Ernest, ER MD  Daralyn, Patient's Nurse     Patient accepted 09/08/24.

## 2024-09-08 NOTE — ED Notes (Signed)
 Had Apple sauce for snack

## 2024-09-08 NOTE — ED Notes (Signed)
 VOL seen by NP, waiting for medical clearance

## 2024-09-08 NOTE — ED Notes (Signed)
 Two rings, one silver colored and one yellow colored, two hoop earrings yellow in color and yellow necklace with cross pendant placed in pt belonging bag.

## 2024-09-09 LAB — LIPID PANEL
Cholesterol: 196 mg/dL (ref 0–200)
HDL: 69 mg/dL
LDL Cholesterol: 105 mg/dL — ABNORMAL HIGH (ref 0–99)
Total CHOL/HDL Ratio: 2.9 ratio
Triglycerides: 109 mg/dL
VLDL: 22 mg/dL (ref 0–40)

## 2024-09-09 LAB — HEMOGLOBIN A1C
Hgb A1c MFr Bld: 5.3 % (ref 4.8–5.6)
Mean Plasma Glucose: 105.41 mg/dL

## 2024-09-09 MED ORDER — CLONAZEPAM 0.5 MG PO TABS: 0.5000 mg | ORAL_TABLET | Freq: Two times a day (BID) | ORAL | Status: DC | PRN

## 2024-09-09 MED ORDER — ENSURE PLUS HIGH PROTEIN PO LIQD
237.0000 mL | Freq: Two times a day (BID) | ORAL | Status: DC
  Administered 2024-09-09 – 2024-09-12 (×6): 237 mL via ORAL
  Filled 2024-09-09 (×13): qty 237

## 2024-09-09 MED ORDER — QUETIAPINE FUMARATE 100 MG PO TABS
100.0000 mg | ORAL_TABLET | Freq: Every day | ORAL | Status: DC
  Administered 2024-09-09 – 2024-09-13 (×5): 100 mg via ORAL
  Filled 2024-09-09 (×5): qty 1

## 2024-09-09 MED ORDER — MELATONIN 3 MG PO TABS
6.0000 mg | ORAL_TABLET | Freq: Every evening | ORAL | Status: DC | PRN
  Administered 2024-09-13: 6 mg via ORAL
  Filled 2024-09-09: qty 2

## 2024-09-09 MED ORDER — INFLUENZA VIRUS VACC SPLIT PF (FLUZONE) 0.5 ML IM SUSY
0.5000 mL | PREFILLED_SYRINGE | INTRAMUSCULAR | Status: AC
  Administered 2024-09-10: 0.5 mL via INTRAMUSCULAR
  Filled 2024-09-09: qty 0.5

## 2024-09-09 MED ORDER — TOPIRAMATE 25 MG PO TABS
50.0000 mg | ORAL_TABLET | Freq: Two times a day (BID) | ORAL | Status: DC
  Administered 2024-09-09 – 2024-09-14 (×10): 50 mg via ORAL
  Filled 2024-09-09 (×11): qty 2

## 2024-09-09 MED ORDER — LACTULOSE 10 GM/15ML PO SOLN
10.0000 g | Freq: Every day | ORAL | Status: DC
  Administered 2024-09-09 – 2024-09-14 (×6): 10 g via ORAL
  Filled 2024-09-09 (×5): qty 15

## 2024-09-09 MED ORDER — FLUOXETINE HCL 20 MG PO CAPS
40.0000 mg | ORAL_CAPSULE | Freq: Every day | ORAL | Status: DC
  Administered 2024-09-09 – 2024-09-11 (×3): 40 mg via ORAL
  Filled 2024-09-09 (×4): qty 2

## 2024-09-09 MED ORDER — NAPROXEN 500 MG PO TABS
500.0000 mg | ORAL_TABLET | Freq: Three times a day (TID) | ORAL | Status: DC
  Administered 2024-09-09 – 2024-09-14 (×14): 500 mg via ORAL
  Filled 2024-09-09 (×14): qty 1

## 2024-09-09 MED ORDER — QUETIAPINE FUMARATE 50 MG PO TABS
50.0000 mg | ORAL_TABLET | Freq: Every morning | ORAL | Status: DC
  Administered 2024-09-10 – 2024-09-14 (×5): 50 mg via ORAL
  Filled 2024-09-09 (×5): qty 1

## 2024-09-09 MED ORDER — PREGABALIN 75 MG PO CAPS
75.0000 mg | ORAL_CAPSULE | Freq: Two times a day (BID) | ORAL | Status: DC
  Administered 2024-09-09 – 2024-09-10 (×2): 75 mg via ORAL
  Filled 2024-09-09 (×2): qty 1

## 2024-09-09 NOTE — Group Note (Signed)
 Date:  09/09/2024 Time:  10:05 AM  Group Topic/Focus:  Goals Group:   The focus of this group is to help patients establish daily goals to achieve during treatment and discuss how the patient can incorporate goal setting into their daily lives to aide in recovery.    Participation Level:  Active  Participation Quality:  Attentive  Affect:  Appropriate  Cognitive:  Alert  Insight: Good  Engagement in Group:  Engaged  Modes of Intervention:  Discussion  Additional Comments:    Nicole Mccarty 09/09/2024, 10:05 AM

## 2024-09-09 NOTE — Plan of Care (Signed)
  Problem: Education: Goal: Knowledge of [place] [facility] Education information/materials will improve Outcome: Progressing Goal: Emotional status will improve Outcome: Progressing   Problem: Education: Goal: Mental status will improve Outcome: Not Progressing
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
## 2024-09-09 NOTE — [facility] Group Notes (Signed)
 Patient did not attend the Social Work group.
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
## 2024-09-09 NOTE — Group Note (Signed)
 LCSW Group Therapy Note   Group Date: 09/09/2024 Start Time: 1100 End Time: 1200   Participation:  patient was present and actively participated in the discussion.  Type of Therapy:  Group Therapy   Topic:  Understanding Your Path to Change.  Objective:  The goal is to help individuals understand the stages of change, identify where they currently are in the process, and provide actionable next steps to continue moving forward in their journey of change.  Goals: - Learn about the six stages of change:  Precontemplation, Contemplation, Preparation, Action, Maintenance, and Relapse - Reflect on Current Change Efforts:  Recognize which stage participants are in regarding a personal change. - Plan Next Steps for Moving Forward:  Create an action plan based on their current stage of change.  Class Summary:  In this session, we explored the Stages of Change as a framework to understand the process of change.  We discussed how each stage helps individuals recognize where they are in their personal journey and used the Stages of Change Worksheet for self-reflection. Participants answered questions to better understand their current stage, challenges, and progress. We also emphasized the importance of moving forward, even if setbacks (Relapse) occur, and created actionable steps to help participants continue progressing. By the end of the session, participants gained a clearer understanding of their path to change and left with a clear plan for next steps.  Therapeutic Modalities: Elements of CBT (cognitive restructuring, problem solving)  Element of DBT (mindfulness, distress tolerance)   Ameriah Lint O Chasady Longwell, LCSWA 09/09/2024  1:03 PM
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
## 2024-09-09 NOTE — Progress Notes (Signed)
 Patient denies SI, HI, AVH. Patient stated they slept Good last night. Scored 9/10 on anxiety and 8/10 on depression. Patient goal is Get me out of here and states will Follow instructions to help reach that goal. Patient has been calm, cooperative, and med compliant.     09/09/24 1000  Psych Admission Type (Psych Patients Only)  Admission Status Voluntary  Psychosocial Assessment  Patient Complaints Anxiety;Depression  Eye Contact Fair  Facial Expression Anxious  Affect Appropriate to circumstance  Speech Logical/coherent  Interaction Assertive  Motor Activity Fidgety  Appearance/Hygiene In scrubs;In hospital gown  Behavior Characteristics Cooperative;Fidgety  Mood Anxious;Pleasant  Thought Process  Coherency Circumstantial  Content WDL  Delusions None reported or observed  Perception WDL  Hallucination None reported or observed  Judgment Impaired  Confusion Mild  Danger to Self  Current suicidal ideation? Denies  Agreement Not to Harm Self Yes  Description of Agreement Verbal  Danger to Others  Danger to Others None reported or observed

## 2024-09-09 NOTE — [facility] Group Notes (Signed)
 Patient did not attend [facility] group.

## 2024-09-09 NOTE — Plan of Care (Signed)
   Problem: Education: Goal: Knowledge of [place] [facility] Education information/materials will improve Outcome: Progressing Goal: Emotional status will improve Outcome: Progressing Goal: Mental status will improve Outcome: Progressing
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
## 2024-09-09 NOTE — Group Note (Signed)
 Recreation Therapy Group Note   Group Topic:Animal Assisted Therapy   Group Date: 09/09/2024 Start Time: 9050 End Time: 1030 Facilitators: Quentin Shorey-McCall, LRT,CTRS Location: [address] Dayroom   Animal-Assisted Activity (AAA) Program Checklist/Progress Notes Patient Eligibility Criteria Checklist & Daily Group note for Rec Tx Intervention  AAA/T Program Assumption of Risk Form signed by Patient/ or Parent Legal Guardian Yes  Patient is free of allergies or severe asthma Yes  Patient reports no fear of animals Yes  Patient reports no history of cruelty to animals Yes  Patient understands his/her participation is voluntary Yes  Patient washes hands before animal contact Yes  Patient washes hands after animal contact Yes  Behavioral Response: Active   Education: Charity Fundraiser, Appropriate Animal Interaction   Education Outcome: Acknowledges education.    Affect/Mood: Appropriate   Participation Level: Active   Participation Quality: Independent   Behavior: Appropriate   Speech/Thought Process: Relevant   Insight: Good   Judgement: Good   Modes of Intervention: Teaching Laboratory Technician   Patient Response to Interventions:  Attentive   Education Outcome:  In group clarification offered    Clinical Observations/Individualized Feedback: Pt was quiet and observant during group. Pt had some positive interaction with therapy dog team.    Plan: Continue to engage patient in RT group sessions 2-3x/week.   Nicole Mccarty, LRT,CTRS 09/09/2024 12:20 PM

## 2024-09-09 NOTE — Plan of Care (Signed)
   Problem: Education: Goal: Knowledge of [facility] Education information/materials will improve Outcome: Progressing Goal: Emotional status will improve Outcome: Progressing Goal: Mental status will improve Outcome: Progressing Goal: Verbalization of understanding the information provided will improve Outcome: Progressing   Problem: Activity: Goal: Interest or engagement in activities will improve Outcome: Progressing

## 2024-09-09 NOTE — Progress Notes (Signed)
(  Sleep Hours) -  (Any PRNs that were needed, meds refused, or side effects to meds)- hydroxyzine 25mg . Trazodone 50mg   (Any disturbances and when (visitation, over night)- none  (Concerns raised by the patient)- none  (SI/HI/AVH)-denies

## 2024-09-09 NOTE — Group Note (Signed)
 Date:  09/09/2024 Time:  11:38 PM  Group Topic/Focus:  Wrap-Up Group:   The focus of this group is to help patients review their daily goal of treatment and discuss progress on daily workbooks.    Participation Level:  Active  Participation Quality:  Appropriate, Redirectable, and Sharing  Affect:  Appropriate and Flat  Cognitive:  Appropriate  Insight: Appropriate and Limited  Engagement in Group:  Distracting, Engaged, and Off Topic  Modes of Intervention:  Activity and Socialization  Additional Comments:  Patients completed Wrap up group sheets.  Patient rated her day a 8. Patient high points from today, making new friends and low point, wanting to go home.   Eward Mace 09/09/2024, 11:38 PM

## 2024-09-09 NOTE — Group Note (Signed)
 Date:  09/09/2024 Time:  5:04 PM  Group Topic/Focus:  Dimensions of Wellness:   The focus of this group is to introduce the topic of wellness and discuss the role each dimension of wellness plays in total health.    Participation Level:  Active  Participation Quality:  Appropriate  Affect:  Appropriate  Cognitive:  Appropriate  Insight: Appropriate  Engagement in Group:  Engaged  Modes of Intervention:  Discussion  Nicole Mccarty 09/09/2024, 5:04 PM

## 2024-09-09 NOTE — Tx Team (Signed)
 Initial Treatment Plan 09/09/2024 1:30 AM Nicole Mccarty [id number]    PATIENT STRESSORS: Medication change or noncompliance     PATIENT STRENGTHS: Capable of independent living  Motivation for treatment/growth    PATIENT IDENTIFIED PROBLEMS: Altered Mental Status                     DISCHARGE CRITERIA:  Improved stabilization in mood, thinking, and/or behavior Need for constant or close observation no longer present Verbal commitment to aftercare and medication compliance  PRELIMINARY DISCHARGE PLAN: Outpatient therapy Return to previous living arrangement  PATIENT/FAMILY INVOLVEMENT: This treatment plan has been presented to and reviewed with the patient, Nicole Mccarty.  The patient and family have been given the opportunity to ask questions and make suggestions.  Sonny JINNY Ferraris, RN 09/09/2024, 1:30 AM

## 2024-09-10 ENCOUNTER — Encounter (HOSPITAL_COMMUNITY): Payer: Self-pay

## 2024-09-10 LAB — COMPREHENSIVE METABOLIC PANEL WITH GFR
ALT: 10 U/L (ref 0–44)
AST: 23 U/L (ref 15–41)
Albumin: 4.1 g/dL (ref 3.5–5.0)
Alkaline Phosphatase: 64 U/L (ref 38–126)
Anion gap: 11 (ref 5–15)
BUN: 26 mg/dL — ABNORMAL HIGH (ref 6–20)
CO2: 18 mmol/L — ABNORMAL LOW (ref 22–32)
Calcium: 9 mg/dL (ref 8.9–10.3)
Chloride: 110 mmol/L (ref 98–111)
Creatinine, Ser: 1.2 mg/dL — ABNORMAL HIGH (ref 0.44–1.00)
GFR, Estimated: 52 mL/min — ABNORMAL LOW
Glucose, Bld: 112 mg/dL — ABNORMAL HIGH (ref 70–99)
Potassium: 5 mmol/L (ref 3.5–5.1)
Sodium: 139 mmol/L (ref 135–145)
Total Bilirubin: 0.2 mg/dL (ref 0.0–1.2)
Total Protein: 7 g/dL (ref 6.5–8.1)

## 2024-09-10 LAB — AMMONIA: Ammonia: 54 umol/L — ABNORMAL HIGH (ref 9–35)

## 2024-09-10 MED ORDER — OXYCODONE-ACETAMINOPHEN 5-325 MG PO TABS
1.0000 | ORAL_TABLET | Freq: Three times a day (TID) | ORAL | Status: DC | PRN
  Administered 2024-09-10: 1 via ORAL
  Filled 2024-09-10: qty 1

## 2024-09-10 MED ORDER — PREGABALIN 50 MG PO CAPS
50.0000 mg | ORAL_CAPSULE | Freq: Two times a day (BID) | ORAL | Status: DC
  Administered 2024-09-10 – 2024-09-14 (×8): 50 mg via ORAL
  Filled 2024-09-10 (×8): qty 1

## 2024-09-10 NOTE — Group Note (Deleted)
 Date:  09/10/2024 Time:  9:24 AM  Group Topic/Focus:  Goals Group:   The focus of this group is to help patients establish daily goals to achieve during treatment and discuss how the patient can incorporate goal setting into their daily lives to aide in recovery. Orientation:   The focus of this group is to educate the patient on the purpose and policies of crisis stabilization and provide a format to answer questions about their admission.  The group details unit policies and expectations of patients while admitted.     Participation Level:  {BHH PARTICIPATION OZCZO:77735}  Participation Quality:  {BHH PARTICIPATION QUALITY:22265}  Affect:  {BHH AFFECT:22266}  Cognitive:  {BHH COGNITIVE:22267}  Insight: {BHH Insight2:20797}  Engagement in Group:  {BHH ENGAGEMENT IN HMNLE:77731}  Modes of Intervention:  {BHH MODES OF INTERVENTION:22269}  Additional Comments:  ***  Nicole Mccarty 09/10/2024, 9:24 AM

## 2024-09-10 NOTE — Group Note (Deleted)
 Date:  09/10/2024 Time:  8:32 AM  Group Topic/Focus:  Goals Group:   The focus of this group is to help patients establish daily goals to achieve during treatment and discuss how the patient can incorporate goal setting into their daily lives to aide in recovery. Orientation:   The focus of this group is to educate the patient on the purpose and policies of crisis stabilization and provide a format to answer questions about their admission.  The group details unit policies and expectations of patients while admitted.     Participation Level:  {BHH PARTICIPATION OZCZO:77735}  Participation Quality:  {BHH PARTICIPATION QUALITY:22265}  Affect:  {BHH AFFECT:22266}  Cognitive:  {BHH COGNITIVE:22267}  Insight: {BHH Insight2:20797}  Engagement in Group:  {BHH ENGAGEMENT IN HMNLE:77731}  Modes of Intervention:  {BHH MODES OF INTERVENTION:22269}  Additional Comments:  ***  Zyanya Glaza 09/10/2024, 8:32 AM

## 2024-09-10 NOTE — Group Note (Signed)
 Date:  09/10/2024 Time:  5:18 PM  Group Topic/Focus:  Managing Feelings:   The focus of this group is to identify what feelings patients have difficulty handling and develop a plan to handle them in a healthier way upon discharge. Overcoming Stress:   The focus of this group is to define stress and help patients assess their triggers.    Participation Level:  Active  Participation Quality:  Appropriate  Affect:  Appropriate  Cognitive:  Appropriate  Insight: Appropriate  Engagement in Group:  Engaged  Modes of Intervention:  Activity  Additional Comments:    Asberry CROME Arnesia Vincelette 09/10/2024, 5:18 PM

## 2024-09-10 NOTE — BH IP Treatment Plan (Signed)
 Interdisciplinary Treatment and Diagnostic Plan Update  09/10/2024 Time of Session: 1110AM Nicole Mccarty MRN: [id number]  Principal Diagnosis: MDD (major depressive disorder), recurrent episode, severe (HCC)  Secondary Diagnoses: Principal Problem:   MDD (major depressive disorder), recurrent episode, severe (HCC)   Current Medications:  Current Facility-Administered Medications  Medication Dose Route Frequency Provider Last Rate Last Admin   acetaminophen  (TYLENOL ) tablet 650 mg  650 mg Oral Q6H PRN Jadapalle, Sree, MD       alum & mag hydroxide-simeth (MAALOX/MYLANTA) 200-200-20 MG/5ML suspension 30 mL  30 mL Oral Q4H PRN Jadapalle, Sree, MD       clonazePAM  (KLONOPIN ) tablet 0.5 mg  0.5 mg Oral BID PRN Prunty, Donald B, DO       diphenhydrAMINE  (BENADRYL ) capsule 25 mg  25 mg Oral Q6H PRN Jadapalle, Sree, MD       haloperidol  (HALDOL ) tablet 5 mg  5 mg Oral TID PRN Jadapalle, Sree, MD       And   diphenhydrAMINE  (BENADRYL ) capsule 50 mg  50 mg Oral TID PRN Jadapalle, Sree, MD       haloperidol  lactate (HALDOL ) injection 5 mg  5 mg Intramuscular TID PRN Jadapalle, Sree, MD       And   diphenhydrAMINE  (BENADRYL ) injection 50 mg  50 mg Intramuscular TID PRN Jadapalle, Sree, MD       And   LORazepam  (ATIVAN ) injection 2 mg  2 mg Intramuscular TID PRN Jadapalle, Sree, MD       haloperidol  lactate (HALDOL ) injection 10 mg  10 mg Intramuscular TID PRN Jadapalle, Sree, MD       And   diphenhydrAMINE  (BENADRYL ) injection 50 mg  50 mg Intramuscular TID PRN Jadapalle, Sree, MD       And   LORazepam  (ATIVAN ) injection 2 mg  2 mg Intramuscular TID PRN Jadapalle, Sree, MD       feeding supplement (ENSURE PLUS HIGH PROTEIN) liquid 237 mL  237 mL Oral BID BM Jadapalle, Sree, MD   237 mL at 09/09/24 0819   FLUoxetine  (PROZAC ) capsule 40 mg  40 mg Oral Daily Lera Golas B, DO   40 mg at 09/10/24 9171   hydrOXYzine  (ATARAX ) tablet 25 mg  25 mg Oral Q6H PRN Jadapalle, Sree, MD   25 mg at  09/09/24 2104   lactulose  (CHRONULAC ) 10 GM/15ML solution 10 g  10 g Oral Daily Prunty, Donald B, DO   10 g at 09/10/24 9171   magnesium  hydroxide (MILK OF MAGNESIA) suspension 30 mL  30 mL Oral Daily PRN Jadapalle, Sree, MD       melatonin tablet 6 mg  6 mg Oral QHS PRN Lera Golas B, DO       naproxen  (NAPROSYN ) tablet 500 mg  500 mg Oral TID WC Prunty, Donald B, DO   500 mg at 09/10/24 1248   pregabalin  (LYRICA ) capsule 75 mg  75 mg Oral BID Prunty, Donald B, DO   75 mg at 09/10/24 9171   QUEtiapine  (SEROQUEL ) tablet 100 mg  100 mg Oral QHS Prunty, Donald B, DO   100 mg at 09/09/24 2104   And   QUEtiapine  (SEROQUEL ) tablet 50 mg  50 mg Oral q AM Lera Golas B, DO   50 mg at 09/10/24 9372   topiramate  (TOPAMAX ) tablet 50 mg  50 mg Oral BID Chandra Charleston Christian Lee, DO   50 mg at 09/10/24 9171   traZODone  (DESYREL ) tablet 50 mg  50 mg Oral QHS PRN  Jadapalle, Sree, MD   50 mg at 09/09/24 2104   PTA Medications: Medications Prior to Admission  Medication Sig Dispense Refill Last Dose/Taking   alum & mag hydroxide-simeth (MAALOX/MYLANTA) 200-200-20 MG/5ML suspension Take 30 mLs by mouth every 4 (four) hours as needed for indigestion or heartburn.      ARIPiprazole (ABILIFY) 5 MG tablet Take 5 mg by mouth daily.      aspirin  EC 81 MG tablet Take 81 mg by mouth daily. (Patient not taking: Reported on 09/08/2024)      B Complex-C (B-COMPLEX WITH VITAMIN C) tablet Take 1 tablet by mouth daily.      benzocaine (ORAJEL) 10 % mucosal gel Use as directed 1 Application in the mouth or throat as needed for mouth pain.      budesonide -formoterol  (SYMBICORT ) 160-4.5 MCG/ACT inhaler Inhale 2 puffs into the lungs in the morning and at bedtime. Rinse mouth (Patient not taking: Reported on 09/08/2024) 1 Inhaler 12    chlorhexidine (PERIDEX) 0.12 % solution Use as directed 15 mLs in the mouth or throat 3 (three) times daily.      clonazePAM  (KLONOPIN ) 0.5 MG tablet Take 0.5 mg by mouth at bedtime.       estradiol -norethindrone  (ACTIVELLA) 1-0.5 MG tablet Take 1 tablet by mouth at bedtime. (Patient not taking: Reported on 09/08/2024) 84 tablet 4    FLUoxetine  (PROZAC ) 40 MG capsule Take 40 mg by mouth daily.      HYDROcodone-acetaminophen  (NORCO/VICODIN) 5-325 MG tablet Take 1 tablet by mouth every 6 (six) hours as needed for moderate pain (pain score 4-6).      hydrocortisone cream 1 % Apply 1 Application topically 4 (four) times daily as needed for itching.      hydrOXYzine  (ATARAX ) 25 MG tablet Take 1 tablet (25 mg total) by mouth 3 (three) times daily as needed. 30 tablet 0    melatonin 3 MG TABS tablet Take 6 mg by mouth at bedtime as needed (Sleep). (Patient not taking: Reported on 09/08/2024)      meloxicam  (MOBIC ) 7.5 MG tablet Take 1-2 tablets (7.5-15 mg total) by mouth daily. 60 tablet 5    methocarbamol  (ROBAXIN ) 500 MG tablet Take 500 mg by mouth every 12 (twelve) hours.      naloxone (NARCAN) nasal spray 4 mg/0.1 mL Place 1 spray into the nose once.      ondansetron  (ZOFRAN -ODT) 4 MG disintegrating tablet Take 1 tablet (4 mg total) by mouth every 8 (eight) hours as needed for nausea or vomiting. 20 tablet 0    oxyCODONE -acetaminophen  (PERCOCET/ROXICET) 5-325 MG tablet Take 1 tablet by mouth every 6 (six) hours as needed for moderate pain (pain score 4-6).      phenol (CHLORASEPTIC) 1.4 % LIQD Use as directed 1 spray in the mouth or throat as needed for throat irritation / pain.      pregabalin  (LYRICA ) 75 MG capsule Take 75 mg by mouth 2 (two) times daily.      senna (SENOKOT) 8.6 MG TABS tablet Take 1 tablet by mouth at bedtime as needed for mild constipation.      traZODone  (DESYREL ) 50 MG tablet Take 2 tablets (100 mg total) by mouth at bedtime. 180 tablet 3    WELLBUTRIN SR 150 MG 12 hr tablet Take 150 mg by mouth 2 (two) times daily.       Patient Stressors: Medication change or noncompliance    Patient Strengths: Capable of independent living  Motivation for treatment/growth    Treatment Modalities:  Medication Management, Group therapy, Case management,  1 to 1 session with clinician, Psychoeducation, Recreational therapy.   Physician Treatment Plan for Primary Diagnosis: MDD (major depressive disorder), recurrent episode, severe (HCC) Long Term Goal(s):     Short Term Goals: Compliance with prescribed medications will improve reduction in anxiety/depressive symptoms.  Medication Management: Evaluate patient's response, side effects, and tolerance of medication regimen.  Therapeutic Interventions: 1 to 1 sessions, Unit Group sessions and Medication administration.  Evaluation of Outcomes: Not Progressing  Physician Treatment Plan for Secondary Diagnosis: Principal Problem:   MDD (major depressive disorder), recurrent episode, severe (HCC)  Long Term Goal(s):     Short Term Goals: Compliance with prescribed medications will improve reduction in anxiety/depressive symptoms.     Medication Management: Evaluate patient's response, side effects, and tolerance of medication regimen.  Therapeutic Interventions: 1 to 1 sessions, Unit Group sessions and Medication administration.  Evaluation of Outcomes: Not Progressing   RN Treatment Plan for Primary Diagnosis: MDD (major depressive disorder), recurrent episode, severe (HCC) Long Term Goal(s): Knowledge of disease and therapeutic regimen to maintain health will improve  Short Term Goals: Ability to remain free from injury will improve, Ability to verbalize frustration and anger appropriately will improve, Ability to demonstrate self-control, Ability to participate in decision making will improve, Ability to verbalize feelings will improve, Ability to disclose and discuss suicidal ideas, Ability to identify and develop effective coping behaviors will improve, and Compliance with prescribed medications will improve  Medication Management: RN will administer medications as ordered by provider, will assess and  evaluate patient's response and provide education to patient for prescribed medication. RN will report any adverse and/or side effects to prescribing provider.  Therapeutic Interventions: 1 on 1 counseling sessions, Psychoeducation, Medication administration, Evaluate responses to treatment, Monitor vital signs and CBGs as ordered, Perform/monitor CIWA, COWS, AIMS and Fall Risk screenings as ordered, Perform wound care treatments as ordered.  Evaluation of Outcomes: Not Progressing   LCSW Treatment Plan for Primary Diagnosis: MDD (major depressive disorder), recurrent episode, severe (HCC) Long Term Goal(s): Safe transition to appropriate next level of care at discharge, Engage patient in therapeutic group addressing interpersonal concerns.  Short Term Goals: Engage patient in aftercare planning with referrals and resources, Increase social support, Increase ability to appropriately verbalize feelings, Increase emotional regulation, Facilitate acceptance of mental health diagnosis and concerns, Facilitate patient progression through stages of change regarding substance use diagnoses and concerns, Identify triggers associated with mental health/substance abuse issues, and Increase skills for wellness and recovery  Therapeutic Interventions: Assess for all discharge needs, 1 to 1 time with Social worker, Explore available resources and support systems, Assess for adequacy in community support network, Educate family and significant other(s) on suicide prevention, Complete Psychosocial Assessment, Interpersonal group therapy.  Evaluation of Outcomes: Not Progressing   Progress in Treatment: Attending groups: Yes. Participating in groups: Yes. Taking medication as prescribed: Yes. Toleration medication: Yes. Family/Significant other contact made: No, will contact:  declined consents Patient understands diagnosis: Yes. Discussing patient identified problems/goals with staff: Yes. Medical problems  stabilized or resolved: Yes. Denies suicidal/homicidal ideation: Yes. Issues/concerns per patient self-inventory: No.  New problem(s) identified: No, Describe:  none  New Short Term/Long Term Goal(s): medication stabilization, elimination of SI thoughts, development of comprehensive mental wellness plan.    Patient Goals:  Be clear minded, make sure my medication adjustments are good, feeling safe  Discharge Plan or Barriers: Patient recently admitted. CSW will continue to follow and assess for appropriate referrals and possible  discharge planning.    Reason for Continuation of Hospitalization: Depression Hallucinations Medication stabilization  Estimated Length of Stay: 5-7 days  Last 3 [place] Suicide Severity Risk Score: Flowsheet Row [facility] Most recent reading at 09/08/2024 10:35 PM ED from 09/08/2024 in [facility] Most recent reading at 09/08/2024 12:49 PM Admission (Discharged) from 10/09/2023 in [facility] Most recent reading at 10/09/2023  8:05 AM  C-SSRS RISK CATEGORY No Risk No Risk No Risk    Last PHQ 2/9 Scores:    03/12/2019   10:30 AM  Depression screen PHQ 2/9  Decreased Interest 0  Down, Depressed, Hopeless 0  PHQ - 2 Score 0    Scribe for Treatment Team: Jenkins LULLA Primer, LCSWA 09/10/2024 1:19 PM

## 2024-09-10 NOTE — BHH Group Notes (Signed)
 BHH Group Notes:  (Nursing/MHT/Case Management/Adjunct)  Date:  09/10/2024  Time:  9:24 PM  Type of Therapy:  NA group  Participation Level:  Active  Participation Quality:  Appropriate  Affect:  Appropriate  Cognitive:  Appropriate  Insight:  Appropriate  Engagement in Group:  Engaged  Modes of Intervention:  Education  Summary of Progress/Problems:Attended NA meeting.  Nicole Mccarty 09/10/2024, 9:24 PM

## 2024-09-10 NOTE — Progress Notes (Signed)
" °   09/10/24 1700  Psych Admission Type (Psych Patients Only)  Admission Status Voluntary  Psychosocial Assessment  Patient Complaints Anxiety  Eye Contact Fair  Facial Expression Animated  Affect Appropriate to circumstance  Speech Logical/coherent  Interaction Assertive  Motor Activity Other (Comment)  Appearance/Hygiene In scrubs  Behavior Characteristics Cooperative  Mood Pleasant  Thought Process  Coherency WDL  Content WDL  Delusions None reported or observed  Perception WDL  Hallucination None reported or observed  Judgment Impaired  Confusion None  Danger to Self  Current suicidal ideation? Denies  Agreement Not to Harm Self Yes  Danger to Others  Danger to Others None reported or observed    "

## 2024-09-10 NOTE — Plan of Care (Signed)

## 2024-09-10 NOTE — Plan of Care (Signed)
   Problem: Safety: Goal: Periods of time without injury will increase Outcome: Progressing

## 2024-09-10 NOTE — Group Note (Deleted)
 Date:  09/10/2024 Time:  8:41 AM  Group Topic/Focus:  Orientation:   The focus of this group is to educate the patient on the purpose and policies of crisis stabilization and provide a format to answer questions about their admission.  The group details unit policies and expectations of patients while admitted.     Participation Level:  {BHH PARTICIPATION OZCZO:77735}  Participation Quality:  {BHH PARTICIPATION QUALITY:22265}  Affect:  {BHH AFFECT:22266}  Cognitive:  {BHH COGNITIVE:22267}  Insight: {BHH Insight2:20797}  Engagement in Group:  {BHH ENGAGEMENT IN HMNLE:77731}  Modes of Intervention:  {BHH MODES OF INTERVENTION:22269}  Additional Comments:  ***  [name] 09/10/2024, 8:41 AM

## 2024-09-10 NOTE — Progress Notes (Signed)
(  Sleep Hours) - 5.25 (Any PRNs that were needed, meds refused, or side effects to meds)- PRN Hydroxyzine  and PRN Trazodone  (Any disturbances and when (visitation, over night)- None (Concerns raised by the patient)- None (SI/HI/AVH)- Denies. Contracts for safety

## 2024-09-10 NOTE — Group Note (Signed)
 Recreation Therapy Group Note   Group Topic:Communication  Group Date: 09/10/2024 Start Time: 0940 End Time: 1005 Facilitators: Shalaya Swailes-McCall, LRT,CTRS Location: 300 Hall Dayroom   Group Topic: Communication, Team Building, Problem Solving  Goal Area(s) Addresses:  Patient will effectively work with peer towards shared goal.  Patient will identify skills used to make activity successful.  Patient will identify how skills used during activity can be applied to reach post d/c goals.   Behavioral Response: Engaged  Intervention: STEM Activity- Glass Blower/designer  Activity: Tallest [employer]. In teams of 5-6, patients were given 11 craft pipe cleaners. Using the materials provided, patients were instructed to compete again the opposing team(s) to build the tallest free-standing structure from floor level. The activity was timed; difficulty increased by clinical research associate as production designer, theatre/television/film continued.  Systematically resources were removed with additional directions for example, placing one arm behind their back, working in silence, and shape stipulations. LRT facilitated post-activity discussion reviewing team processes and necessary communication skills involved in completion. Patients were encouraged to reflect how the skills utilized, or not utilized, in this activity can be incorporated to positively impact support systems post discharge.  Education: Pharmacist, Community, Scientist, Physiological, Discharge Planning   Education Outcome: Acknowledges education/In group clarification offered/Needs additional education.    Affect/Mood: Appropriate   Participation Level: Engaged   Participation Quality: Independent   Behavior: Appropriate   Speech/Thought Process: Focused   Insight: Good   Judgement: Good   Modes of Intervention: STEM Activity   Patient Response to Interventions:  Engaged   Education Outcome:  In group clarification offered    Clinical  Observations/Individualized Feedback: Pt attended and participated in activity.      Plan: Continue to engage patient in RT group sessions 2-3x/week.   Ryan Palermo-McCall, LRT,CTRS 09/10/2024 1:05 PM
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
## 2024-09-10 NOTE — Progress Notes (Signed)
 Spirituality group facilitated by Elia Rockie Sofia, BCC.  Group Description: Group focused on topic of community. Patients participated in facilitated discussion around topic, connecting with one another around experiences and definitions for community. Group members engaged with visual explorer photos, reflecting on what community looks like for them today. Group engaged in discussion around how their definitions of community are present today in hospital.  Modalities: Psycho-social ed, Adlerian, Narrative, MI  Patient Progress: Gracen attended group and actively engaged and participated in group conversation and activities.

## 2024-09-10 NOTE — Group Note (Signed)
 Date:  09/10/2024 Time:  1:31 PM  Group Topic/Focus:  Spirituality:   The focus of this group is to discuss how one's spirituality can aide in recovery.    Participation Level:  Attended  Evin Chirco 09/10/2024, 1:31 PM

## 2024-09-10 NOTE — Group Note (Signed)
 Date:  09/10/2024 Time:  10:16 AM  Group Topic/Focus:  Recreational Therapy    Participation Level:  Attended     Lawrence Mitch 09/10/2024, 10:16 AM

## 2024-09-10 NOTE — Group Note (Signed)
 Date:  09/10/2024 Time:  9:50 AM  Group Topic/Focus:  Goals Group:   The focus of this group is to help patients establish daily goals to achieve during treatment and discuss how the patient can incorporate goal setting into their daily lives to aide in recovery. Orientation:   The focus of this group is to educate the patient on the purpose and policies of crisis stabilization and provide a format to answer questions about their admission.  The group details unit policies and expectations of patients while admitted.    Participation Level:  Did Not Attend   Nicole Mccarty 09/10/2024, 9:50 AM

## 2024-09-11 LAB — COMPREHENSIVE METABOLIC PANEL WITH GFR
ALT: 7 U/L (ref 0–44)
AST: 14 U/L — ABNORMAL LOW (ref 15–41)
Albumin: 3.8 g/dL (ref 3.5–5.0)
Alkaline Phosphatase: 59 U/L (ref 38–126)
Anion gap: 8 (ref 5–15)
BUN: 17 mg/dL (ref 6–20)
CO2: 18 mmol/L — ABNORMAL LOW (ref 22–32)
Calcium: 8.7 mg/dL — ABNORMAL LOW (ref 8.9–10.3)
Chloride: 115 mmol/L — ABNORMAL HIGH (ref 98–111)
Creatinine, Ser: 1.11 mg/dL — ABNORMAL HIGH (ref 0.44–1.00)
GFR, Estimated: 57 mL/min — ABNORMAL LOW
Glucose, Bld: 86 mg/dL (ref 70–99)
Potassium: 4.1 mmol/L (ref 3.5–5.1)
Sodium: 141 mmol/L (ref 135–145)
Total Bilirubin: 0.3 mg/dL (ref 0.0–1.2)
Total Protein: 6 g/dL — ABNORMAL LOW (ref 6.5–8.1)

## 2024-09-11 LAB — AMMONIA: Ammonia: 31 umol/L (ref 9–35)

## 2024-09-11 MED ORDER — OXYCODONE HCL 5 MG PO TABS
5.0000 mg | ORAL_TABLET | Freq: Two times a day (BID) | ORAL | Status: DC | PRN
  Administered 2024-09-11 (×2): 5 mg via ORAL
  Filled 2024-09-11 (×2): qty 1

## 2024-09-11 NOTE — Plan of Care (Signed)
   Problem: Education: Goal: Knowledge of [facility] Education information/materials will improve Outcome: Progressing   Problem: Activity: Goal: Interest or engagement in activities will improve Outcome: Progressing   Problem: Safety: Goal: Periods of time without injury will increase Outcome: Progressing

## 2024-09-11 NOTE — Group Note (Signed)
 Date:  09/11/2024 Time:  2:31 PM  Group Topic/Focus: Social Work  Anger is a normal and natural emotion, but when it is not understood or managed well, it can negatively affect our mental health, relationships, and daily functioning. In this group, we explore how anger often serves as a signal that something feels unfair, threatening, or overwhelming. By learning to recognize early warning signs--such as physical tension, racing thoughts, or irritability--patients can begin to pause before reacting. Anger management skills like deep breathing, grounding techniques, healthy communication, and problem-solving help individuals express their feelings in safer and more constructive ways. Developing these skills empowers patients to gain better emotional control, reduce conflict, and improve overall well-being.    Participation Level:  Active   Dolores HERO Zurri Rudden 09/11/2024, 2:31 PM
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
## 2024-09-11 NOTE — Plan of Care (Signed)

## 2024-09-11 NOTE — Progress Notes (Signed)
 Nicole Mccarty  Type of Note: Discussion w [facility]  CSW spoke with Jasmine at [facility]; she was notified that pt will be discharging this coming Sunday, 09/14/24 and her sister would be facilitating transportation back home.  Jasmine asked if there were any other important things to know, which CSW stated all discharge info would be made available to the ACT team if requested with medical records (pt agreed and signed release of info for ACT to obtain her records for treatment purposes).  Jasmine thanked CSW for notifying her of pt's upcoming discharge from [facility].  Signed: Crissy Mccreadie, LCSW 09/11/2024 4:08 PM

## 2024-09-11 NOTE — Progress Notes (Signed)
 Nurse received call from lab pts ammonia level hemolyzed and will not result. Provider notified.

## 2024-09-11 NOTE — Group Note (Signed)
 Date:  09/11/2024 Time:  9:48 AM  Group Topic/Focus: Goal and orientation  Goals Group:   The focus of this group is to help patients establish daily goals to achieve during treatment and discuss how the patient can incorporate goal setting into their daily lives to aide in recovery. Orientation:   The focus of this group is to educate the patient on the purpose and policies of crisis stabilization and provide a format to answer questions about their admission.  The group details unit policies and expectations of patients while admitted.    Participation Level:  Active  Participation Quality:  Appropriate  Affect:  Appropriate  Cognitive:  Alert  Insight: Appropriate  Engagement in Group:  Engaged  Modes of Intervention:  Orientation  Additional Comments:    Nicole Mccarty 09/11/2024, 9:48 AM

## 2024-09-11 NOTE — [facility] Group Notes (Signed)
 [facility] Group Notes:  (Nursing/MHT/Case Management/Adjunct)  Date:  09/11/2024  Time:  9:19 PM  Type of Therapy:  Wrap up group  Participation Level:  Active  Participation Quality:  Appropriate  Affect:  Appropriate  Cognitive:  Appropriate  Insight:  Appropriate  Engagement in Group:  Engaged  Modes of Intervention:  Education  Summary of Progress/Problems:Goal work on D/C plans. Rated day 9/10.  Nicole Mccarty 09/11/2024, 9:19 PM

## 2024-09-11 NOTE — Group Note (Signed)
 Date:  09/11/2024 Time:  10:13 AM  Group Topic/Focus: Nutrient Group  For adult patients, mental health is strongly supported by adequate intake of key nutrient groups that maintain brain function and emotional regulation. Complex carbohydrates provide a steady source of energy and support serotonin production, while proteins supply essential amino acids needed for neurotransmitters such as dopamine and serotonin. Healthy fats, particularly omega-3 fatty acids, are vital for brain cell membrane integrity and cognitive function. Micronutrients including B-complex vitamins, vitamin D , magnesium , zinc, and iron play critical roles in mood regulation, stress response, and neural signaling, with deficiencies often linked to depression and anxiety. Antioxidants from fruits and vegetables help protect the brain from oxidative stress, and dietary fiber along with probiotics supports the gut-brain axis, which influences mood and behavior. Adequate hydration further supports concentration, energy levels, and emotional stability.    Participation Level:  Active   Nicole Mccarty [name] 09/11/2024, 10:13 AM

## 2024-09-11 NOTE — Group Note (Signed)
 LCSW Group Therapy Note   Group Date: 09/11/2024 Start Time: 1100 End Time: 1200   Participation:  patient was present and actively participated in the discussion.  Type of Therapy:  Group Therapy  Topic:  Healing Flames: Navigating Anger with Compassion  Objective:  Foster self-awareness and promote compassion toward oneself and others when dealing with anger.  Goals:  Help participants understand the underlying emotions and needs fueling anger. Provide coping strategies for healthier emotional expression and anger management.  Summary: This session explored anger as a volcano--an explosion driven by deeper feelings and unmet needs. Participants learned to identify anger triggers and underlying emotions, then practiced coping strategies like deep breathing, physical activity, and journaling. The group discussed healthy ways to manage anger before it escalates, using both personal reflection and shared experiences.  Therapeutic Modalities: Cognitive Behavioral Therapy (CBT): Challenging thoughts that fuel anger. Mindfulness: Increasing awareness of emotions and sensations.   Shriyans Kuenzi O Kalep Full, LCSWA 09/11/2024  2:20 PM

## 2024-09-11 NOTE — Group Note (Signed)
 Date:  09/11/2024 Time:  4:36 PM  Group Topic/Focus:Occupational therapy  Grounding techniques in occupational therapy help adult mental health patients manage stress, anxiety, and emotional distress by bringing attention to the present moment. These techniques use sensory input, physical movement, breathing, or cognitive focus to support emotional regulation and improve participation in daily activities. By integrating grounding strategies into meaningful occupations and routines, occupational therapists promote coping skills, independence, and overall mental well-being.     Participation Level:  Active   Nicole Mccarty 09/11/2024, 4:36 PM

## 2024-09-12 LAB — COMPREHENSIVE METABOLIC PANEL WITH GFR
ALT: 7 U/L (ref 0–44)
AST: 32 U/L (ref 15–41)
Albumin: 3.7 g/dL (ref 3.5–5.0)
Alkaline Phosphatase: 62 U/L (ref 38–126)
Anion gap: 10 (ref 5–15)
BUN: 17 mg/dL (ref 6–20)
CO2: 17 mmol/L — ABNORMAL LOW (ref 22–32)
Calcium: 8.6 mg/dL — ABNORMAL LOW (ref 8.9–10.3)
Chloride: 113 mmol/L — ABNORMAL HIGH (ref 98–111)
Creatinine, Ser: 1.14 mg/dL — ABNORMAL HIGH (ref 0.44–1.00)
GFR, Estimated: 55 mL/min — ABNORMAL LOW
Glucose, Bld: 79 mg/dL (ref 70–99)
Potassium: 5.1 mmol/L (ref 3.5–5.1)
Sodium: 140 mmol/L (ref 135–145)
Total Bilirubin: 0.3 mg/dL (ref 0.0–1.2)
Total Protein: 6.5 g/dL (ref 6.5–8.1)

## 2024-09-12 LAB — AMMONIA: Ammonia: 47 umol/L — ABNORMAL HIGH (ref 9–35)

## 2024-09-12 LAB — PROTIME-INR
INR: 1 (ref 0.8–1.2)
Prothrombin Time: 13.6 s (ref 11.4–15.2)

## 2024-09-12 MED ORDER — ACETAMINOPHEN 325 MG PO TABS: 650.0000 mg | ORAL_TABLET | Freq: Four times a day (QID) | ORAL | Status: DC | PRN

## 2024-09-12 MED ORDER — CLONAZEPAM 0.25 MG PO TBDP: 0.2500 mg | ORAL_TABLET | Freq: Every day | ORAL | Status: DC | PRN

## 2024-09-12 MED ORDER — OXYCODONE HCL 5 MG PO TABS
5.0000 mg | ORAL_TABLET | Freq: Every day | ORAL | Status: DC | PRN
  Administered 2024-09-13 (×2): 5 mg via ORAL
  Filled 2024-09-12 (×2): qty 1

## 2024-09-12 NOTE — Group Note (Signed)
 Date:  09/12/2024 Time:  11:39 AM  Group Topic/Focus: Recreational therapy Word Scramble is a recreational therapy activity that helps adults with mental health conditions improve critical thinking skills. In this activity, participants rearrange scrambled letters to form meaningful words, which encourages problem-solving, decision-making, and concentration. Word Scramble also helps improve attention and memory while reducing stress in a fun and supportive environment. By working individually or in groups, participants practice logical thinking and communication skills, making this activity an effective and engaging tool in recreational therapy.    Participation Level:  Active   Nicole Mccarty HERO Naomi Fitton 09/12/2024, 11:39 AM

## 2024-09-12 NOTE — Progress Notes (Signed)
" °   09/12/24 1011  Psych Admission Type (Psych Patients Only)  Admission Status Voluntary  Psychosocial Assessment  Patient Complaints None  Eye Contact Fair  Facial Expression Animated  Affect Appropriate to circumstance  Speech Logical/coherent  Interaction Assertive  Motor Activity Other (Comment) (WDL)  Appearance/Hygiene Unremarkable  Behavior Characteristics Cooperative;Appropriate to situation;Calm  Mood Pleasant  Thought Process  Coherency WDL  Content WDL  Delusions None reported or observed  Perception WDL  Hallucination None reported or observed  Judgment Poor  Confusion None  Danger to Self  Current suicidal ideation? Denies  Agreement Not to Harm Self Yes  Description of Agreement Verbal  Danger to Others  Danger to Others None reported or observed    "
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
## 2024-09-12 NOTE — Group Note (Signed)
 Date:  09/12/2024 Time:  11:19 AM  Group Topic/Focus: Goals and orientation Goals Group:   The focus of this group is to help patients establish daily goals to achieve during treatment and discuss how the patient can incorporate goal setting into their daily lives to aide in recovery. Orientation:   The focus of this group is to educate the patient on the purpose and policies of crisis stabilization and provide a format to answer questions about their admission.  The group details unit policies and expectations of patients while admitted.    Participation Level:  Active  Participation Quality:  Appropriate  Affect:  Appropriate  Cognitive:  Alert  Insight: Appropriate  Engagement in Group:  Engaged  Modes of Intervention:  Orientation  Additional Comments:    Nicole Mccarty 09/12/2024, 11:19 AM

## 2024-09-12 NOTE — BHH Group Notes (Signed)
 [facility] Group Notes:  (Nursing/MHT/Case Management/Adjunct)  Date:  09/12/2024  Time:  9:04 PM  Type of Therapy:  Group Therapy  Participation Level:  Active  Participation Quality:  Appropriate  Affect:  Appropriate  Cognitive:  Alert and Appropriate  Insight:  Appropriate and Good  Engagement in Group:  Supportive  Modes of Intervention:  Socialization and Support  Summary of Progress/Problems:Pt attended NA  Geni JONELLE Dove 09/12/2024, 9:04 PM

## 2024-09-12 NOTE — Progress Notes (Signed)
(  Sleep Hours) -8 (Any PRNs that were needed, meds refused, or side effects to meds)- oxycodone , Trazodone . (Any disturbances and when (visitation, over night)-n/a (Concerns raised by the patient)- none (SI/HI/AVH)- Denies
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
## 2024-09-12 NOTE — Group Note (Signed)
 Date:  09/12/2024 Time:  6:55 PM  Group Topic/Focus:  Making Healthy Choices:   The focus of this group is to help patients identify negative/unhealthy choices they were using prior to admission and identify positive/healthier diet choices to promote brain health.  Participation Level:  Active  Participation Quality:  Appropriate  Affect:  Appropriate  Cognitive:  Appropriate  Insight: Appropriate  Engagement in Group:  Engaged  Modes of Intervention:  Discussion and Education  Additional Comments:    Juliene CHRISTELLA Huddle 09/12/2024, 6:55 PM

## 2024-09-12 NOTE — Group Note (Deleted)
 Date:  09/12/2024 Time:  8:41 PM  Group Topic/Focus:  Wrap-Up Group:   The focus of this group is to help patients review their daily goal of treatment and discuss progress on daily workbooks.     Participation Level:  {BHH PARTICIPATION OZCZO:77735}  Participation Quality:  {BHH PARTICIPATION QUALITY:22265}  Affect:  {BHH AFFECT:22266}  Cognitive:  {BHH COGNITIVE:22267}  Insight: {BHH Insight2:20797}  Engagement in Group:  {BHH ENGAGEMENT IN HMNLE:77731}  Modes of Intervention:  {BHH MODES OF INTERVENTION:22269}  Additional Comments:  ***  Nicole Mccarty Deidrea Gaetz 09/12/2024, 8:41 PM

## 2024-09-12 NOTE — Group Note (Signed)
 Date:  09/12/2024 Time:  2:38 PM  Group Topic/Focus: Guess that song This song speaks to the quiet exhaustion of adulthood, where responsibilities pile up and emotions are often hidden behind a practiced smile. It reflects the struggle of feeling lost, overwhelmed, and pressured to appear okay while internally battling anxiety and self-doubt. Through honest lyrics and a steady rhythm, the music reminds listeners that its normal to feel broken sometimes--and that asking for help is not a weakness.    Participation Level:  Active  Participation Quality:  Appropriate  Affect:  Appropriate  Cognitive:  Appropriate  Insight: Appropriate  Engagement in Group:  Engaged  Modes of Intervention:  Education  Additional Comments:    Nicole Mccarty 09/12/2024, 2:38 PM

## 2024-09-12 NOTE — Plan of Care (Signed)

## 2024-09-12 NOTE — Group Note (Signed)
 Recreation Therapy Group Note   Group Topic:Problem Solving  Group Date: 09/12/2024 Start Time: 0935 End Time: 1005 Facilitators: Ludivina Guymon-McCall, LRT,CTRS Location: [address] Hall Dayroom   Group Topic: Problem Solving  Goal Area(s) Addresses:  Patient will effectively work in a team with other group members. Patient will verbalize importance of using appropriate problem solving techniques.  Patient will identify positive change associated with effective problem solving skills.   Behavioral Response: Engaged  Intervention: Worksheet  Activity: Dentist. Patients worked together to complete a front and back worksheet of brain teasers. Patients had to work through different thoughts and ideas to come up with the correct answer. Once patients finished, LRT and patients went over the answers.    Education: Problem solving, Team Building, Communication  Education Outcome: Acknowledges understanding/In group clarification offered/Needs additional education.    Affect/Mood: Appropriate   Participation Level: Engaged   Participation Quality: Independent   Behavior: Appropriate   Speech/Thought Process: Focused   Insight: Good   Judgement: Good   Modes of Intervention: Worksheet   Patient Response to Interventions:  Engaged   Education Outcome:  In group clarification offered    Clinical Observations/Individualized Feedback: Pt attended and participated in group session.      Plan: Continue to engage patient in RT group sessions 2-3x/week.   Clorine Swing-McCall, LRT,CTRS 09/12/2024 12:38 PM

## 2024-09-13 DIAGNOSIS — F411 Generalized anxiety disorder: Secondary | ICD-10-CM

## 2024-09-13 DIAGNOSIS — Z8659 Personal history of other mental and behavioral disorders: Secondary | ICD-10-CM

## 2024-09-13 DIAGNOSIS — T50915A Adverse effect of multiple unspecified drugs, medicaments and biological substances, initial encounter: Secondary | ICD-10-CM

## 2024-09-13 DIAGNOSIS — F4310 Post-traumatic stress disorder, unspecified: Secondary | ICD-10-CM

## 2024-09-13 DIAGNOSIS — F05 Delirium due to known physiological condition: Secondary | ICD-10-CM

## 2024-09-13 LAB — COMPREHENSIVE METABOLIC PANEL WITH GFR
ALT: <5 U/L (ref 0–44)
AST: 17 U/L (ref 15–41)
Albumin: 4 g/dL (ref 3.5–5.0)
Alkaline Phosphatase: 64 U/L (ref 38–126)
Anion gap: 9 (ref 5–15)
BUN: 17 mg/dL (ref 6–20)
CO2: 21 mmol/L — ABNORMAL LOW (ref 22–32)
Calcium: 9.1 mg/dL (ref 8.9–10.3)
Chloride: 112 mmol/L — ABNORMAL HIGH (ref 98–111)
Creatinine, Ser: 1.12 mg/dL — ABNORMAL HIGH (ref 0.44–1.00)
GFR, Estimated: 56 mL/min — ABNORMAL LOW
Glucose, Bld: 85 mg/dL (ref 70–99)
Potassium: 4.5 mmol/L (ref 3.5–5.1)
Sodium: 142 mmol/L (ref 135–145)
Total Bilirubin: 0.4 mg/dL (ref 0.0–1.2)
Total Protein: 6.7 g/dL (ref 6.5–8.1)

## 2024-09-13 LAB — AMMONIA: Ammonia: 52 umol/L — ABNORMAL HIGH (ref 9–35)

## 2024-09-13 NOTE — Group Note (Signed)
 Date:  09/13/2024 Time:  4:27 PM  Group Topic/Focus:  Emotional Education:   The focus of this group is to discuss what feelings/emotions are, and how they are experienced. Wellness Toolbox:   The focus of this group is to discuss various aspects of wellness, balancing those aspects and exploring ways to increase the ability to experience wellness.  Patients will create a wellness toolbox for use upon discharge.    Participation Level:  Active  Participation Quality:  Appropriate  Affect:  Appropriate  Cognitive:  Appropriate  Insight: Appropriate  Engagement in Group:  Engaged  Modes of Intervention:  Activity  Additional Comments:  Pt attended and participated.  Lusine Corlett 09/13/2024, 4:27 PM

## 2024-09-13 NOTE — BHH Group Notes (Signed)
"          [facility] LCSW Group Therapy Note  Date/Time:  09/13/2024 10:10am-11:10am  Type of Therapy and Topic:  Group Therapy:  Healthy and Unhealthy Supports  Participation Level:  Active   Description of Group:  Patients in this group explored the differences between healthy and unhealthy supports.  Patients identified their current healthy supports as well as supports they wished they had but do not currently have.  Psychoeducation was provided about needing to be part of our own support system and there was a discussion about how important it is to participate in self-support in order to have ownership in progress that is made.  Some patients were opposed to the idea of reducing contact with unhealthy supports and this led to a healthy group discussion.  A demonstration was given about how to set boundaries with family and/or friends, which patients expressed was beneficial.    Therapeutic Goals:   1)  compare healthy versus unhealthy supports and identify individuals' current healthy and unhealthy supports 2)  identify additional examples of each and demonstrate commonalities within the group  3)  discuss importance of adding healthy supports and setting boundaries with unhealthy supports  5)  offer mutual support about how to address unhealthy supports  6)  encourage active participation in group    Summary of Patient Progress:  The patient expressed that her peers from the [address] and [address] halls are her supports, along with the hospital doctors, her outpatient providers, and her family.  She stated she wished she still had the support of people whom she lost in the past year, but that is not possible.  CSW suggested maybe she could gather support from their memories and what they would want for her.  She participated fully and was not shy about disagreeing with people.   Therapeutic Modalities:   Psychoeducation Processing  Elgie Crest, LCSW       "

## 2024-09-13 NOTE — Progress Notes (Addendum)
" °   09/13/24 1538  SDOH Transportation Screening (Complete 2 screening rows OR apply appropriate MACRO for Patient declined or Patient unable to answer)  In the past 12 months, has lack of transportation kept you from medical appointments or from getting medications? yes  In the past 12 months, has lack of transportation kept you from meetings, work, or from getting things needed for daily living? Yes  AMBULATORY ONLY: Non-Emergency Transportation Screening  1.  Is the Patient in the clinic and in need of immediate transportation? Yes  2. Can the visit be virtual? No  3.  Can the patient coordinate their transportation (family/friends;taxi;Uber/Lyft/public transportation) No  4. Does the patient have transportation benefits via an financial controller (i.e.,[place] Medicaid; Medicare Advantage) AND are there 3 business days between request and appointment No  5. Is the patient new to the [facility] system? No  6. Does the patient live within 25 miles of the practice? No  SDOH Interventions  Transportation Interventions Conservation Officer, Nature Given   Waiver signed by patient and sent to [employer], LCSW   "

## 2024-09-13 NOTE — Group Note (Signed)
 Date:  09/13/2024 Time:  4:54 PM  Group Topic/Focus:  Group Topic: Physical Wellness  Description: Group focused on physical wellness and its impact on mental health. Education was provided on healthy habits including sleep, nutrition, hydration, exercise, and self-care. Participants were encouraged to discuss how physical health affects mood, stress levels, and overall functioning. Engagement and participation were appropriate.   Participation Level:  Active   Crecencio Kwiatek 09/13/2024, 4:54 PM

## 2024-09-13 NOTE — Plan of Care (Signed)
   Problem: Coping: Goal: Ability to verbalize frustrations and anger appropriately will improve Outcome: Progressing

## 2024-09-13 NOTE — Group Note (Signed)
 Date:  09/13/2024 Time:  5:52 PM  Group Topic/Focus:  Group used a non- competitive card game to build mindful listening skills and encourage acceptance and understanding oneself and peers. Patients share, foster empathy, and personal growth in a supportive environment.    Participation Level:  Active  Participation Quality:  Attentive  Affect:  Appropriate  Cognitive:  Alert  Insight: Appropriate  Engagement in Group:  Engaged  Modes of Intervention:  Activity, Rapport Building, Socialization, and Support   Nicole Mccarty 09/13/2024, 5:52 PM

## 2024-09-13 NOTE — BHH Suicide Risk Assessment (Signed)
 BHH INPATIENT:  Family/Significant Other Suicide Prevention Education  Suicide Prevention Education was reviewed thoroughly with patient, including risk factors, warning signs, and what to do.  Mobile Crisis services were described and that telephone number pointed out, with encouragement to patient to put this number in personal cell phone.  Brochure was provided to patient to share with natural supports.  Patient acknowledged the ways in which they are at risk, and how working through each of their issues can gradually start to reduce their risk factors.  Patient was encouraged to think of the information in the context of people in their own lives.  Patient denied having access to firearms  Patient verbalized understanding of information provided.  Patient endorsed a desire to live.   [employer], LCSW

## 2024-09-13 NOTE — Progress Notes (Signed)
" °   09/13/24 1600  Psych Admission Type (Psych Patients Only)  Admission Status Voluntary  Psychosocial Assessment  Patient Complaints Anxiety  Eye Contact Fair  Facial Expression Animated  Affect Anxious;Appropriate to circumstance  Speech Soft  Interaction Assertive  Motor Activity Fidgety  Appearance/Hygiene Unremarkable  Behavior Characteristics Cooperative  Mood Anxious  Thought Process  Coherency WDL  Content WDL  Delusions None reported or observed  Perception WDL  Hallucination None reported or observed  Judgment Impaired  Danger to Self  Current suicidal ideation? Denies  Agreement Not to Harm Self Yes  Description of Agreement Verbal  Danger to Others  Danger to Others None reported or observed    "

## 2024-09-13 NOTE — Group Note (Signed)
 Date:  09/13/2024 Time:  5:25 PM  Group Topic/Focus:  Wellness Toolbox:   The focus of this group is to discuss various aspects of wellness, balancing those aspects and exploring ways to increase the ability to experience wellness.  Patients will create a wellness toolbox for use upon discharge.  The nurse played a game with the patients that promotes intellectual wellness.     Participation Level:  Active   Nicole Mccarty 09/13/2024, 5:25 PM
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
## 2024-09-13 NOTE — Plan of Care (Signed)
   Problem: Education: Goal: Emotional status will improve Outcome: Progressing Goal: Mental status will improve Outcome: Progressing   Problem: Activity: Goal: Interest or engagement in activities will improve Outcome: Progressing

## 2024-09-13 NOTE — Group Note (Signed)
 Date:  09/13/2024 Time:  3:04 PM  Group Topic/Focus:  Goals Group:   The focus of this group is to help patients establish daily goals to achieve during treatment and discuss how the patient can incorporate goal setting into their daily lives to aide in recovery. Orientation:   The focus of this group is to educate the patient on the purpose and policies of crisis stabilization and provide a format to answer questions about their admission.  The group details unit policies and expectations of patients while admitted.    Participation Level:  Active  Participation Quality:  Appropriate  Affect:  Appropriate  Cognitive:  Appropriate  Insight: Appropriate  Engagement in Group:  Engaged  Modes of Intervention:  Activity  Additional Comments:  Pt attended group and participated.   Hughes Wyndham 09/13/2024, 3:04 PM

## 2024-09-13 NOTE — Group Note (Unsigned)
 Date:  09/13/2024 Time:  5:59 PM  Group Topic/Focus:  Group used a non- competitive card game to build mindful listening skills and encouraging acceptance and understanding of oneself and peers.     Participation Level:  {BHH PARTICIPATION OZCZO:77735}  Participation Quality:  {BHH PARTICIPATION QUALITY:22265}  Affect:  {BHH AFFECT:22266}  Cognitive:  {BHH COGNITIVE:22267}  Insight: {BHH Insight2:20797}  Engagement in Group:  {BHH ENGAGEMENT IN HMNLE:77731}  Modes of Intervention:  {BHH MODES OF INTERVENTION:22269}  Additional Comments:  ***  Nicole Mccarty 09/13/2024, 5:59 PM

## 2024-09-14 DIAGNOSIS — F319 Bipolar disorder, unspecified: Secondary | ICD-10-CM

## 2024-09-14 LAB — COMPREHENSIVE METABOLIC PANEL WITH GFR
ALT: 6 U/L (ref 0–44)
AST: 13 U/L — ABNORMAL LOW (ref 15–41)
Albumin: 3.9 g/dL (ref 3.5–5.0)
Alkaline Phosphatase: 62 U/L (ref 38–126)
Anion gap: 10 (ref 5–15)
BUN: 20 mg/dL (ref 6–20)
CO2: 18 mmol/L — ABNORMAL LOW (ref 22–32)
Calcium: 8.8 mg/dL — ABNORMAL LOW (ref 8.9–10.3)
Chloride: 111 mmol/L (ref 98–111)
Creatinine, Ser: 1.03 mg/dL — ABNORMAL HIGH (ref 0.44–1.00)
GFR, Estimated: >60 mL/min
Glucose, Bld: 81 mg/dL (ref 70–99)
Potassium: 3.9 mmol/L (ref 3.5–5.1)
Sodium: 140 mmol/L (ref 135–145)
Total Bilirubin: 0.3 mg/dL (ref 0.0–1.2)
Total Protein: 6.6 g/dL (ref 6.5–8.1)

## 2024-09-14 LAB — AMMONIA: Ammonia: 42 umol/L — ABNORMAL HIGH (ref 9–35)

## 2024-09-14 MED ORDER — LACTULOSE 10 GM/15ML PO SOLN: 10.0000 g | Freq: Every day | ORAL | 0 refills | Status: AC

## 2024-09-14 MED ORDER — TOPIRAMATE 50 MG PO TABS: 50.0000 mg | ORAL_TABLET | Freq: Two times a day (BID) | ORAL | 0 refills | Status: AC

## 2024-09-14 MED ORDER — QUETIAPINE FUMARATE 50 MG PO TABS: ORAL_TABLET | ORAL | 0 refills | Status: AC

## 2024-09-14 MED ORDER — HYDROXYZINE HCL 25 MG PO TABS: 25.0000 mg | ORAL_TABLET | Freq: Four times a day (QID) | ORAL | 0 refills | Status: AC | PRN

## 2024-09-14 NOTE — Group Note (Signed)
 Date:  09/14/2024 Time:  6:02 AM  Group Topic/Focus:  Wrap-Up Group:   The focus of this group is to help patients review their daily goal of treatment and discuss progress on daily workbooks.    Participation Level:  Active  Participation Quality:  Appropriate  Affect:  Appropriate  Cognitive:  Appropriate  Insight: Appropriate  Engagement in Group:  Engaged  Modes of Intervention:  Discussion  Additional Comments:   Pt was engaged in group discussion  Gabrielly Mccrystal A Mahina Salatino 09/14/2024, 6:02 AM

## 2024-09-14 NOTE — BHH Suicide Risk Assessment (Signed)
 Suicide Risk Assessment  Discharge Assessment    [facility] Discharge Suicide Risk Assessment   Principal Problem: MDD (major depressive disorder), recurrent episode, severe (HCC) Discharge Diagnoses: Principal Problem:   MDD (major depressive disorder), recurrent episode, severe (HCC)   Musculoskeletal: Strength & Muscle Tone: within normal limits Gait & Station: normal Patient leans: N/A  Psychiatric Specialty Exam  Presentation  General Appearance: Appropriate for Environment; Casual  Eye Contact:Good  Speech:Clear and Coherent; Normal Rate  Speech Volume:Normal  Mood and Affect  Mood:Euthymic  Affect:Appropriate; Congruent; Full Range   Thought Process  Thought Processes:Coherent; Goal Directed; Linear  Descriptions of Associations:Intact  Orientation:Full (Time, Place and Person)  Thought Content:Logical  Hallucinations:Hallucinations: None  Ideas of Reference:None  Suicidal Thoughts:Suicidal Thoughts: No  Homicidal Thoughts:Homicidal Thoughts: No   Sensorium  Memory:Immediate Good; Recent Good  Judgment:Fair  Insight:Fair   Executive Functions  Concentration:Good  Attention Span:Good  Recall:Good  Fund of Knowledge:Good  Language:Good   Psychomotor Activity  Psychomotor Activity:Psychomotor Activity: Normal   Assets  Assets:Communication Skills; Desire for Improvement   Sleep  Sleep:Sleep: Good  Estimated Sleeping Duration (Last 24 Hours): 6.75-7.00 hours  Physical Exam: Physical Exam Vitals and nursing note reviewed.  Constitutional:      General: She is not in acute distress.    Appearance: Normal appearance. She is normal weight. She is not ill-appearing.  HENT:     Head: Normocephalic.  Pulmonary:     Effort: Pulmonary effort is normal. No respiratory distress.  Neurological:     General: No focal deficit present.     Mental Status: She is alert and oriented to person, place, and time. Mental status is at baseline.     Review of Systems  Gastrointestinal:  Negative for abdominal pain, constipation, diarrhea, nausea and vomiting.  Neurological:  Negative for dizziness and headaches.  All other systems reviewed and are negative.  Blood pressure 102/73, pulse 66, temperature (!) 97.3 F (36.3 C), resp. rate 12, height 5' 4 (1.626 m), weight 65.4 kg, SpO2 99%. Body mass index is 24.75 kg/m.  Mental Status Per Nursing Assessment::   On Admission:  NA  Demographic Factors:  Caucasian  Loss Factors: NA  Historical Factors: Prior suicide attempts and Impulsivity  Risk Reduction Factors:   Positive social support  Continued Clinical Symptoms:  Bipolar disorder  Cognitive Features That Contribute To Risk:  None    Suicide Risk:  Mild: Suicidal ideation of limited frequency, intensity, duration, and specificity. There are no identifiable plans, no associated intent, mild dysphoria and related symptoms, good self-control (both objective and subjective assessment), few other risk factors, and identifiable protective factors, including available and accessible social support.   Follow-up Information     [facility] Follow up on 09/16/2024.   Why: You may call this provider on 09/16/24 at 9:00 am if you wish to schedule an appointment.  The appointment will be held in person.  Following this appointment, you will be scheduled for a clinical assessment to obtain therapy and medication management services. Contact information: [address] [place] [address] [phone]         [facility] Assessment Services Follow up.   Why: Please continue with this provider for ACTT services. Contact information: [address] [place], [place] [address] Phone: [phone]                Discharge recommendations:   Continue psychiatric medications as prescribed. Follow up with outpatient psychiatric provider and primary  care physician as scheduled. Follow-up  for abnormal lab results: elevated ammonia levels Follow-up for management of chronic disease: chronic pain Abstain from or limit alcohol, illicit drugs, and tobacco due to their negative impact on psychiatric and medical health. In the event of worsening symptoms, the patient is instructed to call the national crisis hotline (988), 911, or go the the closest ED for appropriate evaluation and treatment of symptoms.  Activity: as tolerated Diet: heart-healthy  Ashley LOISE Gravely, MD 09/14/2024, 9:02 AM

## 2024-09-14 NOTE — Progress Notes (Signed)
" °  [facility] Adult Case Management Discharge Plan :  Will you be returning to the same living situation after discharge:  Yes,  returning to own home At discharge, do you have transportation home?: Yes,  taxi voucher provided Do you have the ability to pay for your medications: Yes,  has insurance to cover costs of medications  Release of information consent forms completed and in the chart;  Patient's signature needed at discharge.  Patient to Follow up at:  Follow-up Information     [facility] Follow up on 09/16/2024.   Why: You may call this provider on 09/16/24 at 9:00 am if you wish to schedule an appointment.  The appointment will be held in person.  Following this appointment, you will be scheduled for a clinical assessment to obtain therapy and medication management services. Contact information: [address] [place] [address] [phone]         [facility] Assessment Services Follow up.   Why: Please continue with this provider for ACTT services. Contact information: [address] [place], [place] [address] Phone: [phone]                Next level of care provider has access to [facility] Link:no  Safety Planning and Suicide Prevention discussed: Yes,  completed with patient on 09/13/24     Has patient been referred to the Quitline?: Patient does not use tobacco/nicotine products  Patient has been referred for addiction treatment: No known substance use disorder.  [address] [place], [place] 09/14/2024, 9:00 AM "

## 2024-09-14 NOTE — Progress Notes (Signed)
(  Sleep Hours) -7.5 (Any PRNs that were needed, meds refused, or side effects to meds)- Melatonin, oxycodone , Trazodone . (Any disturbances and when (visitation, over night)- n/a (Concerns raised by the patient)- none (SI/HI/AVH)- denies
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
## 2024-09-14 NOTE — Progress Notes (Signed)
 Pt discharged to lobby. Pt was stable and appreciative at that time. All papers and prescriptions were given and valuables returned. Verbal understanding expressed. Denies SI/HI and A/VH. Pt given opportunity to express concerns and ask questions.

## 2024-09-14 NOTE — Plan of Care (Signed)
   Problem: Education: Goal: Knowledge of [facility] Education information/materials will improve Outcome: Progressing   Problem: Activity: Goal: Interest or engagement in activities will improve Outcome: Progressing   Problem: Coping: Goal: Ability to verbalize frustrations and anger appropriately will improve Outcome: Progressing   Problem: Safety: Goal: Periods of time without injury will increase Outcome: Progressing

## 2024-09-14 NOTE — Group Note (Signed)
 Date:  09/14/2024 Time:  9:11 AM  Group Topic/Focus:  Goals Group:   The focus of this group is to help patients establish daily goals to achieve during treatment and discuss how the patient can incorporate goal setting into their daily lives to aide in recovery.    Participation Level:  Active  Participation Quality:  Appropriate  Affect:  Appropriate  Cognitive:  Appropriate  Insight: Appropriate  Engagement in Group:  Improving  Modes of Intervention:  Discussion  Additional Comments:  Pt was an active listener during group.  Melanye Hiraldo A Zianna Dercole 09/14/2024, 9:11 AM

## 2024-09-14 NOTE — Plan of Care (Signed)
" °  Problem: Education: Goal: Knowledge of [facility] Education information/materials will improve Outcome: Adequate for Discharge Goal: Emotional status will improve 09/14/2024 0806 by Jennye Inocente PARAS, RN Outcome: Adequate for Discharge 09/13/2024 1820 by Jennye Inocente PARAS, RN Outcome: Progressing Goal: Mental status will improve 09/14/2024 0806 by Jennye Inocente PARAS, RN Outcome: Adequate for Discharge 09/13/2024 1820 by Jennye Inocente PARAS, RN Outcome: Progressing Goal: Verbalization of understanding the information provided will improve Outcome: Adequate for Discharge   Problem: Activity: Goal: Interest or engagement in activities will improve 09/14/2024 0806 by Jennye Inocente PARAS, RN Outcome: Adequate for Discharge 09/13/2024 1820 by Jennye Inocente PARAS, RN Outcome: Progressing Goal: Sleeping patterns will improve Outcome: Adequate for Discharge   Problem: Coping: Goal: Ability to verbalize frustrations and anger appropriately will improve Outcome: Adequate for Discharge Goal: Ability to demonstrate self-control will improve Outcome: Adequate for Discharge   Problem: Health Behavior/Discharge Planning: Goal: Identification of resources available to assist in meeting health care needs will improve Outcome: Adequate for Discharge Goal: Compliance with treatment plan for underlying cause of condition will improve Outcome: Adequate for Discharge   Problem: Physical Regulation: Goal: Ability to maintain clinical measurements within normal limits will improve Outcome: Adequate for Discharge   Problem: Safety: Goal: Periods of time without injury will increase Outcome: Adequate for Discharge   "

## 2024-09-23 ENCOUNTER — Other Ambulatory Visit: Payer: Self-pay | Admitting: Adult Medicine

## 2024-09-23 DIAGNOSIS — Z1231 Encounter for screening mammogram for malignant neoplasm of breast: Secondary | ICD-10-CM
